# Patient Record
Sex: Female | Born: 1990 | Race: White | Hispanic: No | Marital: Single | Smoking: Never smoker
Health system: Southern US, Community
[De-identification: ages and names within clinical notes are randomized; demographics above are authoritative.]

## PROBLEM LIST (undated history)

## (undated) DIAGNOSIS — F32A Depression, unspecified: Secondary | ICD-10-CM

## (undated) DIAGNOSIS — F329 Major depressive disorder, single episode, unspecified: Secondary | ICD-10-CM

## (undated) DIAGNOSIS — F419 Anxiety disorder, unspecified: Secondary | ICD-10-CM

## (undated) DIAGNOSIS — K219 Gastro-esophageal reflux disease without esophagitis: Secondary | ICD-10-CM

## (undated) DIAGNOSIS — I10 Essential (primary) hypertension: Secondary | ICD-10-CM

## (undated) DIAGNOSIS — N946 Dysmenorrhea, unspecified: Secondary | ICD-10-CM

## (undated) DIAGNOSIS — J45990 Exercise induced bronchospasm: Secondary | ICD-10-CM

## (undated) DIAGNOSIS — T7840XA Allergy, unspecified, initial encounter: Secondary | ICD-10-CM

## (undated) HISTORY — DX: Anxiety disorder, unspecified: F41.9

## (undated) HISTORY — PX: WISDOM TOOTH EXTRACTION: SHX21

## (undated) HISTORY — DX: Allergy, unspecified, initial encounter: T78.40XA

## (undated) HISTORY — PX: TONSILLECTOMY: SUR1361

## (undated) HISTORY — DX: Essential (primary) hypertension: I10

## (undated) HISTORY — DX: Depression, unspecified: F32.A

---
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
## 2011-06-09 ENCOUNTER — Emergency Department (HOSPITAL_BASED_OUTPATIENT_CLINIC_OR_DEPARTMENT_OTHER)
Admission: EM | Admit: 2011-06-09 | Discharge: 2011-06-09 | Disposition: A | Discharge status: Home / Self Care | Payer: Medicaid Other | Attending: Emergency Medicine | Admitting: Emergency Medicine

## 2011-06-09 ENCOUNTER — Other Ambulatory Visit: Payer: Self-pay

## 2011-06-09 ENCOUNTER — Emergency Department (INDEPENDENT_AMBULATORY_CARE_PROVIDER_SITE_OTHER): Payer: Medicaid Other

## 2011-06-09 DIAGNOSIS — N390 Urinary tract infection, site not specified: Secondary | ICD-10-CM

## 2011-06-09 DIAGNOSIS — R002 Palpitations: Secondary | ICD-10-CM

## 2011-06-09 DIAGNOSIS — R42 Dizziness and giddiness: Secondary | ICD-10-CM | POA: Insufficient documentation

## 2011-06-09 HISTORY — DX: Exercise induced bronchospasm: J45.990

## 2011-06-09 LAB — URINALYSIS, ROUTINE W REFLEX MICROSCOPIC
Nitrite: NEGATIVE
Specific Gravity, Urine: 1.01 (ref 1.005–1.030)
Urobilinogen, UA: 0.2 mg/dL (ref 0.0–1.0)
pH: 6.5 (ref 5.0–8.0)

## 2011-06-09 LAB — BASIC METABOLIC PANEL
CO2: 25 mEq/L (ref 19–32)
Calcium: 10 mg/dL (ref 8.4–10.5)
Chloride: 103 mEq/L (ref 96–112)
Creatinine, Ser: 0.8 mg/dL (ref 0.50–1.10)
GFR calc Af Amer: >90 mL/min (ref >90)
Sodium: 140 mEq/L (ref 135–145)

## 2011-06-09 LAB — CBC
Platelets: 287 10*3/uL (ref 150–400)
RBC: 4.78 MIL/uL (ref 3.87–5.11)
RDW: 11.9 % (ref 11.5–15.5)
WBC: 7.3 10*3/uL (ref 4.0–10.5)

## 2011-06-09 LAB — URINE MICROSCOPIC-ADD ON

## 2011-06-09 LAB — D-DIMER, QUANTITATIVE: D-Dimer, Quant: 0.23 ug/mL-FEU (ref 0.00–0.48)

## 2011-06-09 MED ORDER — SULFAMETHOXAZOLE-TRIMETHOPRIM 800-160 MG PO TABS: 1.0000 | ORAL_TABLET | Freq: Two times a day (BID) | ORAL | Status: AC

## 2011-06-09 MED ORDER — SULFAMETHOXAZOLE-TRIMETHOPRIM 800-160 MG PO TABS: 1.0000 | ORAL_TABLET | Freq: Two times a day (BID) | ORAL | Status: DC

## 2011-06-09 NOTE — ED Notes (Signed)
Pt reports dizziness, nausea x 2 months and heart palpitations.

## 2011-06-09 NOTE — ED Provider Notes (Signed)
History     CSN: [id number]  Arrival date & time 06/09/11  1502   First MD Initiated Contact with Patient 06/09/11 1530      Chief Complaint  Patient presents with  . Dizziness    (Consider location/radiation/quality/duration/timing/severity/associated sxs/prior treatment) HPI Comments: Pt states that she has a history of palpitation but it would be once every couple of days, now she is having them a couple of times a day:pt has never been seen for them:pt states that she is on birth control which is new in the last couple of weeks:pt states that when that happens she gets dizzy and feels near syncopal and sometimes feels nauseated:pt states that she only has one caffeine drink per day   Patient is a 20 y.o. female presenting with palpitations. The history is provided by the patient. No language interpreter was used.  Palpitations  This is a new problem. The current episode started more than 1 week ago. The problem occurs daily. The problem has been gradually worsening. The problem is associated with an unknown factor. Associated symptoms include near-syncope. Pertinent negatives include no fever and no nausea. She has tried nothing for the symptoms.    Past Medical History  Diagnosis Date  . Exercise-induced asthma     History reviewed. No pertinent past surgical history.  No family history on file.  History  Substance Use Topics  . Smoking status: Never Smoker   . Smokeless tobacco: Not on file  . Alcohol Use: No    OB History    Grav Para Term Preterm Abortions TAB SAB Ect Mult Living                  Review of Systems  Constitutional: Negative for fever.  Cardiovascular: Positive for palpitations and near-syncope.  Gastrointestinal: Negative for nausea.  All other systems reviewed and are negative.    Allergies  Banana and Tomato  Home Medications   Current Outpatient Rx  Name Route Sig Dispense Refill  . ALBUTEROL SULFATE HFA 108 (90 BASE) MCG/ACT IN  AERS Inhalation Inhale 2 puffs into the lungs every 6 (six) hours as needed. For shortness of breath or wheezing     . DIPHENHYDRAMINE HCL 25 MG PO CAPS Oral Take 25-50 mg by mouth every 6 (six) hours as needed. For allergic reaction     . NORETHINDRONE ACET-ETHINYL EST 1.5-30 MG-MCG PO TABS Oral Take 1 tablet by mouth daily.      Marland Kitchen EPINEPHRINE 0.3 MG/0.3ML IJ DEVI Intramuscular Inject 0.3 mg into the muscle once.        BP 117/66  Pulse 73  Temp(Src) 98.9 F (37.2 C) (Oral)  Resp 16  Ht 5\' 4"  (1.626 m)  Wt 117 lb (53.071 kg)  BMI 20.08 kg/m2  SpO2 100%  LMP 05/19/2011  Physical Exam  Nursing note and vitals reviewed. Constitutional: She is oriented to person, place, and time. She appears well-developed and well-nourished.  HENT:  Head: Normocephalic and atraumatic.  Eyes: Pupils are equal, round, and reactive to light.  Neck: Normal range of motion. Neck supple.  Cardiovascular: Normal rate and regular rhythm.   Pulmonary/Chest: Effort normal and breath sounds normal.  Abdominal: Soft. Bowel sounds are normal.  Musculoskeletal: Normal range of motion.  Neurological: She is alert and oriented to person, place, and time.  Skin: Skin is warm and dry.  Psychiatric: She has a normal mood and affect.    ED Course  Procedures (including critical care time)  Labs Reviewed  URINALYSIS, ROUTINE W REFLEX MICROSCOPIC - Abnormal; Notable for the following:    Hgb urine dipstick TRACE (*)    Leukocytes, UA MODERATE (*)    All other components within normal limits  URINE MICROSCOPIC-ADD ON - Abnormal; Notable for the following:    Squamous Epithelial / LPF FEW (*)    All other components within normal limits  PREGNANCY, URINE  CBC  BASIC METABOLIC PANEL  D-DIMER, QUANTITATIVE   No results found.   Date: 06/09/2011  Rate: 65  Rhythm: normal sinus rhythm  QRS Axis: normal  Intervals: normal  ST/T Wave abnormalities: normal  Conduction Disutrbances:none  Narrative  Interpretation:   Old EKG Reviewed: none available   1. UTI (lower urinary tract infection)   2. Palpitation       MDM  Pt to follow up with cardiology for possible holter        Teressa Lower, NP 06/09/11 1736

## 2011-06-09 NOTE — ED Notes (Signed)
Pt aware of need for UA.  Pt states she is unable at present.

## 2011-06-11 NOTE — ED Provider Notes (Signed)
History/physical exam/procedure(s) were performed by non-physician practitioner and as supervising physician I was immediately available for consultation/collaboration. I have reviewed all notes and am in agreement with care and plan.   Adonai Helzer S Royalty Domagala, MD 06/11/11 1851 

## 2011-06-19 ENCOUNTER — Emergency Department (HOSPITAL_BASED_OUTPATIENT_CLINIC_OR_DEPARTMENT_OTHER)
Admission: EM | Admit: 2011-06-19 | Discharge: 2011-06-20 | Disposition: A | Discharge status: Home / Self Care | Payer: Medicaid Other | Attending: Emergency Medicine | Admitting: Emergency Medicine

## 2011-06-19 ENCOUNTER — Encounter (HOSPITAL_BASED_OUTPATIENT_CLINIC_OR_DEPARTMENT_OTHER): Payer: Self-pay | Admitting: *Deleted

## 2011-06-19 ENCOUNTER — Other Ambulatory Visit: Payer: Self-pay

## 2011-06-19 DIAGNOSIS — R059 Cough, unspecified: Secondary | ICD-10-CM | POA: Insufficient documentation

## 2011-06-19 DIAGNOSIS — R079 Chest pain, unspecified: Secondary | ICD-10-CM | POA: Insufficient documentation

## 2011-06-19 DIAGNOSIS — K219 Gastro-esophageal reflux disease without esophagitis: Secondary | ICD-10-CM | POA: Insufficient documentation

## 2011-06-19 DIAGNOSIS — R05 Cough: Secondary | ICD-10-CM

## 2011-06-19 DIAGNOSIS — Z79899 Other long term (current) drug therapy: Secondary | ICD-10-CM | POA: Insufficient documentation

## 2011-06-19 HISTORY — DX: Dysmenorrhea, unspecified: N94.6

## 2011-06-19 HISTORY — DX: Gastro-esophageal reflux disease without esophagitis: K21.9

## 2011-06-19 NOTE — ED Notes (Signed)
Pt reports she has had a cold- onset of chest pain last night- "tight"- also c/o SOB

## 2011-06-20 MED ORDER — HYDROCODONE-ACETAMINOPHEN 5-325 MG PO TABS
1.0000 | ORAL_TABLET | Freq: Once | ORAL | Status: AC
  Administered 2011-06-20: 1 via ORAL
  Filled 2011-06-20: qty 1

## 2011-06-20 MED ORDER — HYDROCODONE-ACETAMINOPHEN 5-325 MG PO TABS: 1.0000 | ORAL_TABLET | ORAL | Status: AC | PRN

## 2011-06-20 NOTE — ED Provider Notes (Signed)
History     CSN: [id number]  Arrival date & time 06/19/11  2325   First MD Initiated Contact with Patient 06/19/11 2340      Chief Complaint  Patient presents with  . Chest Pain  . Cough    (Consider location/radiation/quality/duration/timing/severity/associated sxs/prior treatment) The history is provided by the patient.   the patient reports several days of congestion and reports developing a "tightness in her chest last night.  She reports discomfort in her chest is worse when she sneezes or coughs.  It is worse with palpation.  She denies shortness of breath.  She denies unilateral leg swelling.  She has no prior history of DVT or pulmonary embolism.  She is on birth control but she does not smoke cigarettes.  She's tried ibuprofen and for the pain which has helped.  She reports her cough and her pain are not allowing her to sleep.  Past Medical History  Diagnosis Date  . Exercise-induced asthma   . Acid reflux   . Dysmenorrhea     Past Surgical History  Procedure Date  . Wisdom tooth extraction     No family history on file.  History  Substance Use Topics  . Smoking status: Never Smoker   . Smokeless tobacco: Never Used  . Alcohol Use: No    OB History    Grav Para Term Preterm Abortions TAB SAB Ect Mult Living                  Review of Systems  All other systems reviewed and are negative.    Allergies  Banana and Tomato  Home Medications   Current Outpatient Rx  Name Route Sig Dispense Refill  . CIPROFLOXACIN HCL 500 MG PO TABS Oral Take 500 mg by mouth 2 (two) times daily.      . ALBUTEROL SULFATE HFA 108 (90 BASE) MCG/ACT IN AERS Inhalation Inhale 2 puffs into the lungs every 6 (six) hours as needed. For shortness of breath or wheezing     . DIPHENHYDRAMINE HCL 25 MG PO CAPS Oral Take 25-50 mg by mouth every 6 (six) hours as needed. For allergic reaction     . EPINEPHRINE 0.3 MG/0.3ML IJ DEVI Intramuscular Inject 0.3 mg into the muscle once.        Marland Kitchen HYDROCODONE-ACETAMINOPHEN 5-325 MG PO TABS Oral Take 1 tablet by mouth every 4 (four) hours as needed for pain. 8 tablet 0  . NORETHINDRONE ACET-ETHINYL EST 1.5-30 MG-MCG PO TABS Oral Take 1 tablet by mouth daily.      . SULFAMETHOXAZOLE-TRIMETHOPRIM 800-160 MG PO TABS Oral Take 1 tablet by mouth every 12 (twelve) hours. 6 tablet 0    BP 114/70  Pulse 71  Temp 97.5 F (36.4 C)  Resp 17  Ht 5\' 4"  (1.626 m)  Wt 117 lb (53.071 kg)  BMI 20.08 kg/m2  SpO2 100%  LMP 06/13/2011  Physical Exam  Nursing note and vitals reviewed. Constitutional: She is oriented to person, place, and time. She appears well-developed and well-nourished. No distress.  HENT:  Head: Normocephalic and atraumatic.  Eyes: EOM are normal.  Neck: Normal range of motion.  Cardiovascular: Normal rate, regular rhythm and normal heart sounds.   Pulmonary/Chest: Effort normal and breath sounds normal.  Abdominal: Soft. She exhibits no distension. There is no tenderness.  Musculoskeletal: Normal range of motion.  Neurological: She is alert and oriented to person, place, and time.  Skin: Skin is warm and dry.  Psychiatric: She has a  normal mood and affect. Judgment normal.    ED Course  Procedures (including critical care time)   Date: 06/20/2011  Rate: 71  Rhythm: normal sinus rhythm  QRS Axis: normal  Intervals: normal  ST/T Wave abnormalities: normal  Conduction Disutrbances:none  Narrative Interpretation:   Old EKG Reviewed: No significant changes noted     Labs Reviewed - No data to display No results found.   1. Cough   2. Chest pain       MDM  The patient is well-appearing.  Her chest pain seems to be musculoskeletal as it is worse with coughing and sneezing.  She'll use her albuterol inhaler at home to help with cough.  8 hydrocodone prescribed to help with discomfort at nighttime.        Lyanne Co, MD 06/20/11 [phone]

## 2011-10-28 ENCOUNTER — Encounter (HOSPITAL_COMMUNITY): Payer: Self-pay | Admitting: *Deleted

## 2011-10-28 ENCOUNTER — Emergency Department (HOSPITAL_COMMUNITY)
Admission: EM | Admit: 2011-10-28 | Discharge: 2011-10-28 | Disposition: A | Discharge status: Home / Self Care | Payer: Medicaid Other | Attending: Emergency Medicine | Admitting: Emergency Medicine

## 2011-10-28 DIAGNOSIS — R55 Syncope and collapse: Secondary | ICD-10-CM | POA: Insufficient documentation

## 2011-10-28 DIAGNOSIS — R110 Nausea: Secondary | ICD-10-CM | POA: Insufficient documentation

## 2011-10-28 DIAGNOSIS — R42 Dizziness and giddiness: Secondary | ICD-10-CM | POA: Insufficient documentation

## 2011-10-28 DIAGNOSIS — K219 Gastro-esophageal reflux disease without esophagitis: Secondary | ICD-10-CM | POA: Insufficient documentation

## 2011-10-28 DIAGNOSIS — Z79899 Other long term (current) drug therapy: Secondary | ICD-10-CM | POA: Insufficient documentation

## 2011-10-28 LAB — URINALYSIS, ROUTINE W REFLEX MICROSCOPIC
Bilirubin Urine: NEGATIVE
Glucose, UA: NEGATIVE mg/dL
Hgb urine dipstick: NEGATIVE
Specific Gravity, Urine: 1.027 (ref 1.005–1.030)
Urobilinogen, UA: 0.2 mg/dL (ref 0.0–1.0)

## 2011-10-28 LAB — URINE MICROSCOPIC-ADD ON

## 2011-10-28 LAB — GLUCOSE, CAPILLARY: Glucose-Capillary: 97 mg/dL (ref 70–99)

## 2011-10-28 MED ORDER — SODIUM CHLORIDE 0.9 % IV BOLUS (SEPSIS)
1000.0000 mL | Freq: Once | INTRAVENOUS | Status: AC
  Administered 2011-10-28: 1000 mL via INTRAVENOUS

## 2011-10-28 MED ORDER — ONDANSETRON HCL 4 MG/2ML IJ SOLN
4.0000 mg | Freq: Once | INTRAMUSCULAR | Status: AC
  Administered 2011-10-28: 4 mg via INTRAVENOUS
  Filled 2011-10-28: qty 2

## 2011-10-28 MED ORDER — ONDANSETRON HCL 4 MG PO TABS: 4.0000 mg | ORAL_TABLET | Freq: Four times a day (QID) | ORAL | Status: AC

## 2011-10-28 NOTE — Discharge Instructions (Signed)
Near-Syncope Near-syncope is sudden weakness, dizziness, or feeling like you might pass out (faint). This may occur when getting up after sitting or while standing for a long period of time. Near-syncope can be caused by a drop in blood pressure. This is a common reaction, but it may occur to a greater degree in people taking medicines to control their blood pressure. Fainting often occurs when the blood pressure or pulse is too low to provide enough blood flow to the brain to keep you conscious. Fainting and near-syncope are not usually due to serious medical problems. However, certain people should be more cautious in the event of near-syncope, including elderly patients, patients with diabetes, and patients with a history of heart conditions (especially irregular rhythms).  CAUSES   Drop in blood pressure.   Physical pain.   Dehydration.   Heat exhaustion.   Emotional distress.   Low blood sugar.   Internal bleeding.   Heart and circulatory problems.   Infections.  SYMPTOMS   Dizziness.   Feeling sick to your stomach (nauseous).   Nearly fainting.   Body numbness.   Turning pale.   Tunnel vision.   Weakness.  HOME CARE INSTRUCTIONS   Lie down right away if you start feeling like you might faint. Breathe deeply and steadily. Wait until all the symptoms have passed. Most of these episodes last only a few minutes. You may feel tired for several hours.   Drink enough fluids to keep your urine clear or pale yellow.   If you are taking blood pressure or heart medicine, get up slowly, taking several minutes to sit and then stand. This can reduce dizziness that is caused by a drop in blood pressure.  SEEK IMMEDIATE MEDICAL CARE IF:   You have a severe headache.   Unusual pain develops in the chest, abdomen, or back.   There is bleeding from the mouth or rectum, or you have black or tarry stool.   An irregular heartbeat or a very rapid pulse develops.   You have  repeated fainting or seizure-like jerking during an episode.   You faint when sitting or lying down.   You develop confusion.   You have difficulty walking.   Severe weakness develops.   Vision problems develop.  MAKE SURE YOU:   Understand these instructions.   Will watch your condition.   Will get help right away if you are not doing well or get worse.  Document Released: 05/29/2005 Document Revised: 05/18/2011 Document Reviewed: 07/15/2010 Exit[employer] Patient Information 2012 [employer]. 

## 2011-10-28 NOTE — ED Notes (Signed)
Pt to be discharged once IV fluids are finished infusing.

## 2011-10-28 NOTE — ED Notes (Signed)
Pt waiting for IV fluids to finish infusing. Pt will then be discharged.

## 2011-10-28 NOTE — ED Notes (Signed)
Pt ambulatory to restroom without issue.

## 2011-10-28 NOTE — ED Notes (Signed)
Pt states this morning starting around 8 am she had an near syncope episode, which happened about 3 more times, pt states this also happen last Saturday but it is happening more often now. Pt states she gets dizzy, sweaty, and cold then it will just go away. She does not completely pass out. Pt is alert and oriented in no distress.

## 2011-10-28 NOTE — ED Provider Notes (Signed)
History     CSN: [id number]  Arrival date & time 10/28/11  1149   First MD Initiated Contact with Patient 10/28/11 1150      Chief Complaint  Patient presents with  . Near Syncope    (Consider location/radiation/quality/duration/timing/severity/associated sxs/prior treatment) HPI Comments: Presents with lightheadedness and associated nausea. States this happens typically when she is at work and standing for long periods of time.  Patient is a 21 y.o. female presenting with syncope. The history is provided by the patient. No language interpreter was used.  Loss of Consciousness This is a recurrent problem. The current episode started 1 to 2 hours ago. The problem occurs every several days. The problem has been gradually improving. Pertinent negatives include no chest pain, no abdominal pain, no headaches and no shortness of breath. The symptoms are aggravated by nothing. The symptoms are relieved by lying down. She has tried nothing for the symptoms. The treatment provided no relief.    Past Medical History  Diagnosis Date  . Exercise-induced asthma   . Acid reflux   . Dysmenorrhea     Past Surgical History  Procedure Date  . Wisdom tooth extraction   . Tonsillectomy     History reviewed. No pertinent family history.  History  Substance Use Topics  . Smoking status: Never Smoker   . Smokeless tobacco: Never Used  . Alcohol Use: No    OB History    Grav Para Term Preterm Abortions TAB SAB Ect Mult Living                  Review of Systems  Constitutional: Negative for fever, chills, activity change, appetite change and fatigue.  HENT: Negative for congestion, sore throat, rhinorrhea, neck pain and neck stiffness.   Respiratory: Negative for cough and shortness of breath.   Cardiovascular: Positive for syncope. Negative for chest pain and palpitations.  Gastrointestinal: Negative for nausea, vomiting and abdominal pain.  Genitourinary: Negative for dysuria,  urgency, frequency and flank pain.  Musculoskeletal: Negative for myalgias, back pain and arthralgias.  Neurological: Positive for light-headedness. Negative for dizziness, weakness, numbness and headaches.  All other systems reviewed and are negative.    Allergies  Banana; Peanuts; and Tomato  Home Medications   Current Outpatient Rx  Name Route Sig Dispense Refill  . CETIRIZINE HCL 10 MG PO TABS Oral Take 10 mg by mouth daily.    Marland Kitchen DIPHENHYDRAMINE HCL 25 MG PO CAPS Oral Take 25-50 mg by mouth every 6 (six) hours as needed. For allergic reaction    . ESCITALOPRAM OXALATE 5 MG PO TABS Oral Take 5 mg by mouth daily.    . NORETHINDRONE ACET-ETHINYL EST 1.5-30 MG-MCG PO TABS Oral Take 1 tablet by mouth daily.      Marland Kitchen ONDANSETRON HCL 4 MG PO TABS Oral Take 1 tablet (4 mg total) by mouth every 6 (six) hours. 12 tablet 0    BP 122/69  Pulse 98  Temp(Src) 98.6 F (37 C) (Oral)  SpO2 94%  LMP 10/16/2011  Physical Exam  Nursing note and vitals reviewed. Constitutional: She is oriented to person, place, and time. She appears well-developed and well-nourished. No distress.  HENT:  Head: Normocephalic and atraumatic.  Mouth/Throat: Oropharynx is clear and moist.  Eyes: Conjunctivae and EOM are normal. Pupils are equal, round, and reactive to light.  Neck: Normal range of motion. Neck supple.  Cardiovascular: Normal rate, regular rhythm, normal heart sounds and intact distal pulses.  Exam reveals no gallop  and no friction rub.   No murmur heard. Pulmonary/Chest: Effort normal and breath sounds normal. No respiratory distress. She exhibits no tenderness.  Abdominal: Soft. Bowel sounds are normal. There is no tenderness.  Musculoskeletal: Normal range of motion. She exhibits no tenderness.  Neurological: She is alert and oriented to person, place, and time. No cranial nerve deficit.  Skin: Skin is warm and dry. No rash noted.    ED Course  Procedures (including critical care  time)  Labs Reviewed  URINALYSIS, ROUTINE W REFLEX MICROSCOPIC - Abnormal; Notable for the following:    Protein, ur 100 (*)    All other components within normal limits  URINE MICROSCOPIC-ADD ON - Abnormal; Notable for the following:    Squamous Epithelial / LPF FEW (*)    All other components within normal limits  PREGNANCY, URINE  GLUCOSE, CAPILLARY   No results found.   1. Near syncope       MDM  Vasovagal near syncope. Receive urinalysis is negative. HCG is negative. Blood glucose is normal. She'll be discharged with instructions to continue aggressive oral hydration at home. Instructed to followup with her primary care physician.a liter of IV fluids.        Dayton Bailiff, MD 10/28/11 1253

## 2011-10-28 NOTE — ED Notes (Signed)
Per pt she's been having near syncope episodes, it started this morning around 8 am, has happen about 3 times, pt states she gets dizzy, cold, sweaty. Pt states the syncope spell usually goes away on its own. This also happen last Saturday but has become more frequent. Denies being a diabetic or having low blood pressure. Denies eating or drinking anything new.

## 2011-10-28 NOTE — ED Notes (Signed)
Pt given discharge instructions and explained them to the pt, pt in no distress upon discharge, escorted to the discharge window.

## 2012-02-05 ENCOUNTER — Encounter (HOSPITAL_BASED_OUTPATIENT_CLINIC_OR_DEPARTMENT_OTHER): Payer: Self-pay | Admitting: Emergency Medicine

## 2012-02-05 ENCOUNTER — Emergency Department (HOSPITAL_BASED_OUTPATIENT_CLINIC_OR_DEPARTMENT_OTHER)
Admission: EM | Admit: 2012-02-05 | Discharge: 2012-02-05 | Disposition: A | Discharge status: Home / Self Care | Payer: Medicaid Other | Attending: Emergency Medicine | Admitting: Emergency Medicine

## 2012-02-05 DIAGNOSIS — K219 Gastro-esophageal reflux disease without esophagitis: Secondary | ICD-10-CM | POA: Insufficient documentation

## 2012-02-05 DIAGNOSIS — H9209 Otalgia, unspecified ear: Secondary | ICD-10-CM | POA: Insufficient documentation

## 2012-02-05 DIAGNOSIS — J45990 Exercise induced bronchospasm: Secondary | ICD-10-CM | POA: Insufficient documentation

## 2012-02-05 MED ORDER — DESLORATADINE 5 MG PO TABS: 5.0000 mg | ORAL_TABLET | Freq: Every day | ORAL | Status: DC

## 2012-02-05 NOTE — ED Provider Notes (Signed)
History     CSN: [id number]  Arrival date & time 02/05/12  2322   First MD Initiated Contact with Patient 02/05/12 2323      Chief Complaint  Patient presents with  . Otalgia    (Consider location/radiation/quality/duration/timing/severity/associated sxs/prior treatment) Patient is a 21 y.o. female presenting with ear pain. The history is provided by the patient. No language interpreter was used.  Otalgia This is a new problem. The current episode started yesterday. There is pain in the left ear. The problem occurs constantly. There has been no fever. The pain is severe. Pertinent negatives include no ear discharge, no rhinorrhea and no diarrhea. Her past medical history does not include chronic ear infection.    Past Medical History  Diagnosis Date  . Exercise-induced asthma   . Acid reflux   . Dysmenorrhea     Past Surgical History  Procedure Date  . Wisdom tooth extraction   . Tonsillectomy     No family history on file.  History  Substance Use Topics  . Smoking status: Never Smoker   . Smokeless tobacco: Never Used  . Alcohol Use: No    OB History    Grav Para Term Preterm Abortions TAB SAB Ect Mult Living                  Review of Systems  Constitutional: Negative for fever.  HENT: Positive for ear pain. Negative for rhinorrhea and ear discharge.   Gastrointestinal: Negative for diarrhea.  All other systems reviewed and are negative.    Allergies  Banana; Peanuts; and Tomato  Home Medications   Current Outpatient Rx  Name Route Sig Dispense Refill  . CETIRIZINE HCL 10 MG PO TABS Oral Take 10 mg by mouth daily.    . DESLORATADINE 5 MG PO TABS Oral Take 1 tablet (5 mg total) by mouth daily. 7 tablet 0  . DIPHENHYDRAMINE HCL 25 MG PO CAPS Oral Take 25-50 mg by mouth every 6 (six) hours as needed. For allergic reaction    . ESCITALOPRAM OXALATE 5 MG PO TABS Oral Take 5 mg by mouth daily.    . NORETHINDRONE ACET-ETHINYL EST 1.5-30 MG-MCG PO TABS  Oral Take 1 tablet by mouth daily.        BP 116/70  Pulse 73  Temp 97.9 F (36.6 C) (Oral)  Resp 18  Ht 5\' 4"  (1.626 m)  Wt 117 lb (53.071 kg)  BMI 20.08 kg/m2  SpO2 100%  Physical Exam  Constitutional: She appears well-developed and well-nourished.  HENT:  Head: Normocephalic and atraumatic.  Right Ear: No mastoid tenderness. Tympanic membrane is not injected, not scarred and not perforated. No hemotympanum.  Left Ear: No mastoid tenderness. Tympanic membrane is not injected, not scarred and not perforated. No hemotympanum.  Mouth/Throat: Oropharynx is clear and moist.  Eyes: Conjunctivae are normal. Pupils are equal, round, and reactive to light.  Neck: Normal range of motion. Neck supple.  Cardiovascular: Normal rate and regular rhythm.   Pulmonary/Chest: Effort normal and breath sounds normal. She has no wheezes. She has no rales.  Abdominal: Soft. Bowel sounds are normal.  Musculoskeletal: Normal range of motion.  Neurological: She is alert.  Skin: Skin is warm and dry.  Psychiatric: She has a normal mood and affect.    ED Course  Procedures (including critical care time)  Labs Reviewed - No data to display No results found.   1. Otalgia       MDM  Eustachian tube congestion,  will prescribe decongestant       Kharlie Bring K Deshondra Worst-Rasch, MD 02/05/12 2332

## 2012-02-05 NOTE — ED Notes (Signed)
Pt c/o left ear pain.

## 2013-04-05 IMAGING — CR DG CHEST 2V
2 series · 2 of 2 positions shown · non-contrast
Comparison: None.

CLINICAL DATA: Dizziness, palpitations

CHEST - 2 VIEW

[w chest pa]
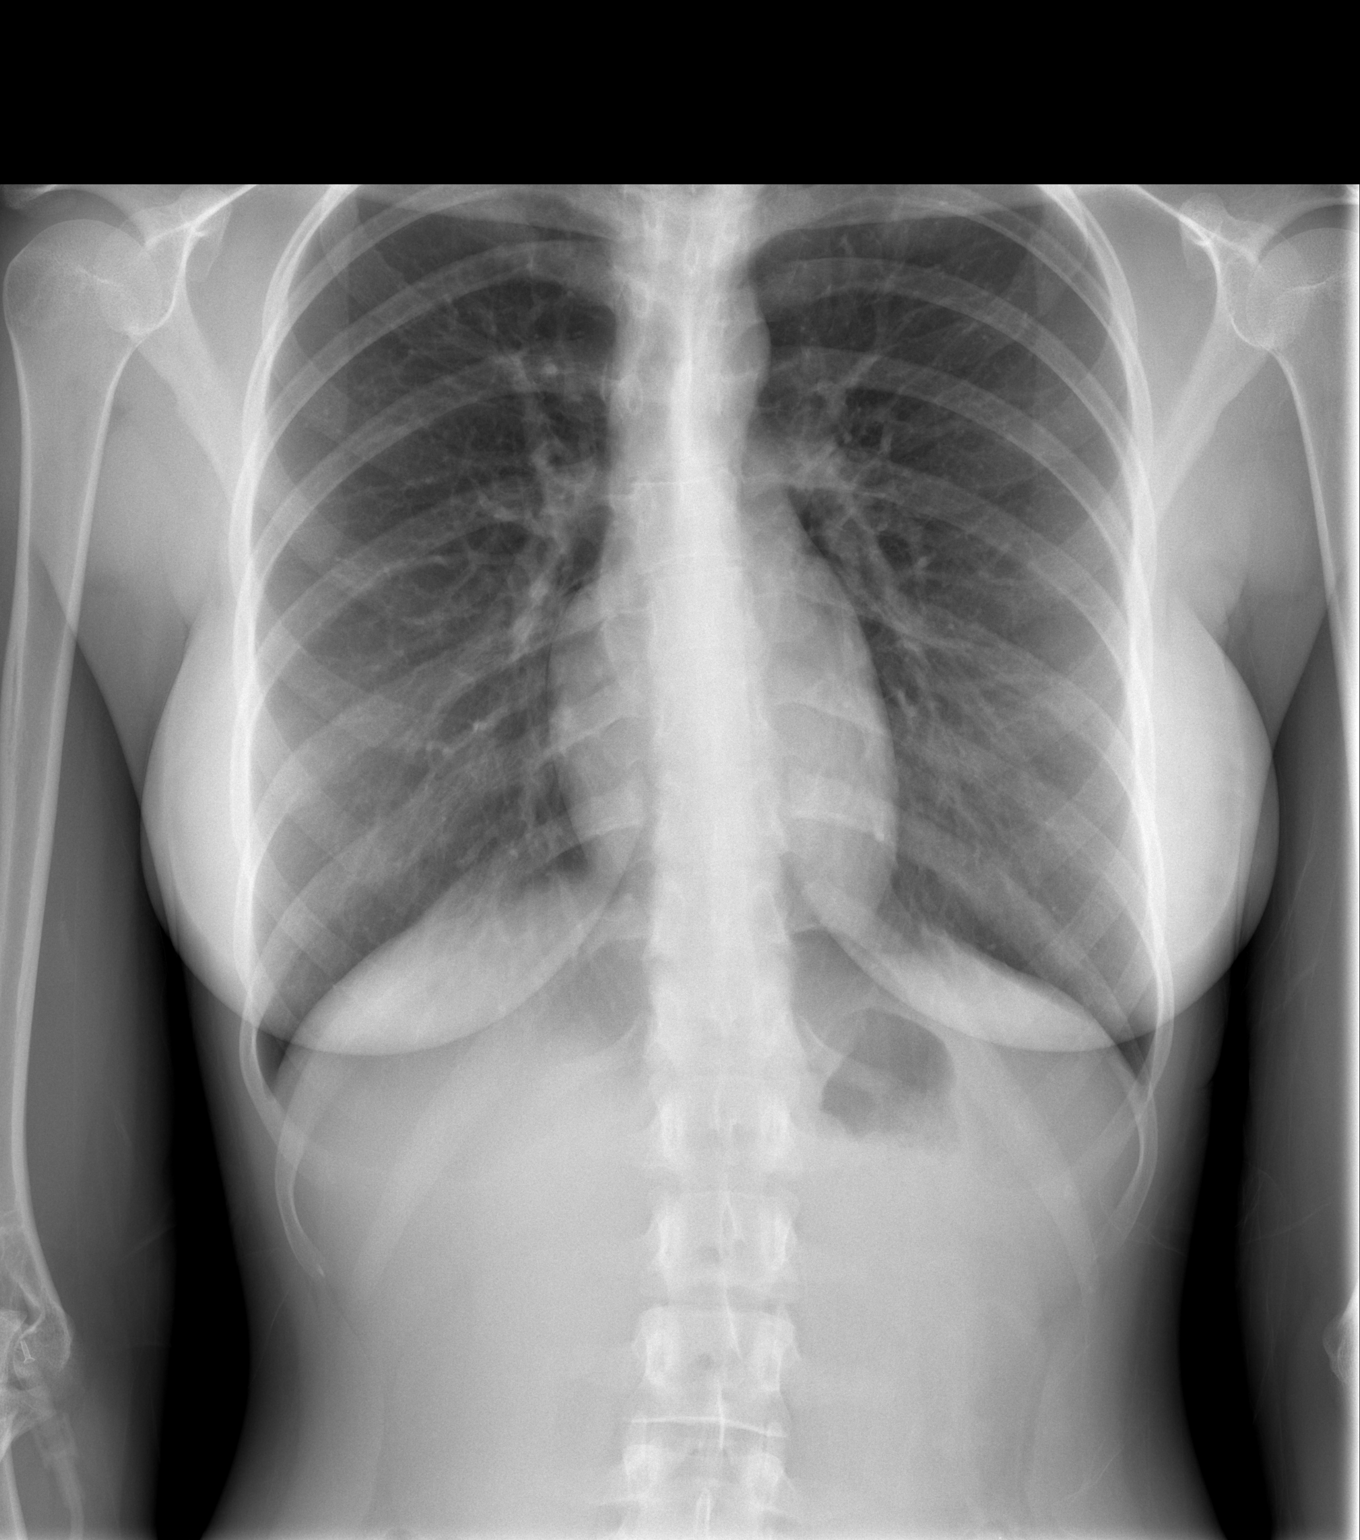

[w chest lat]
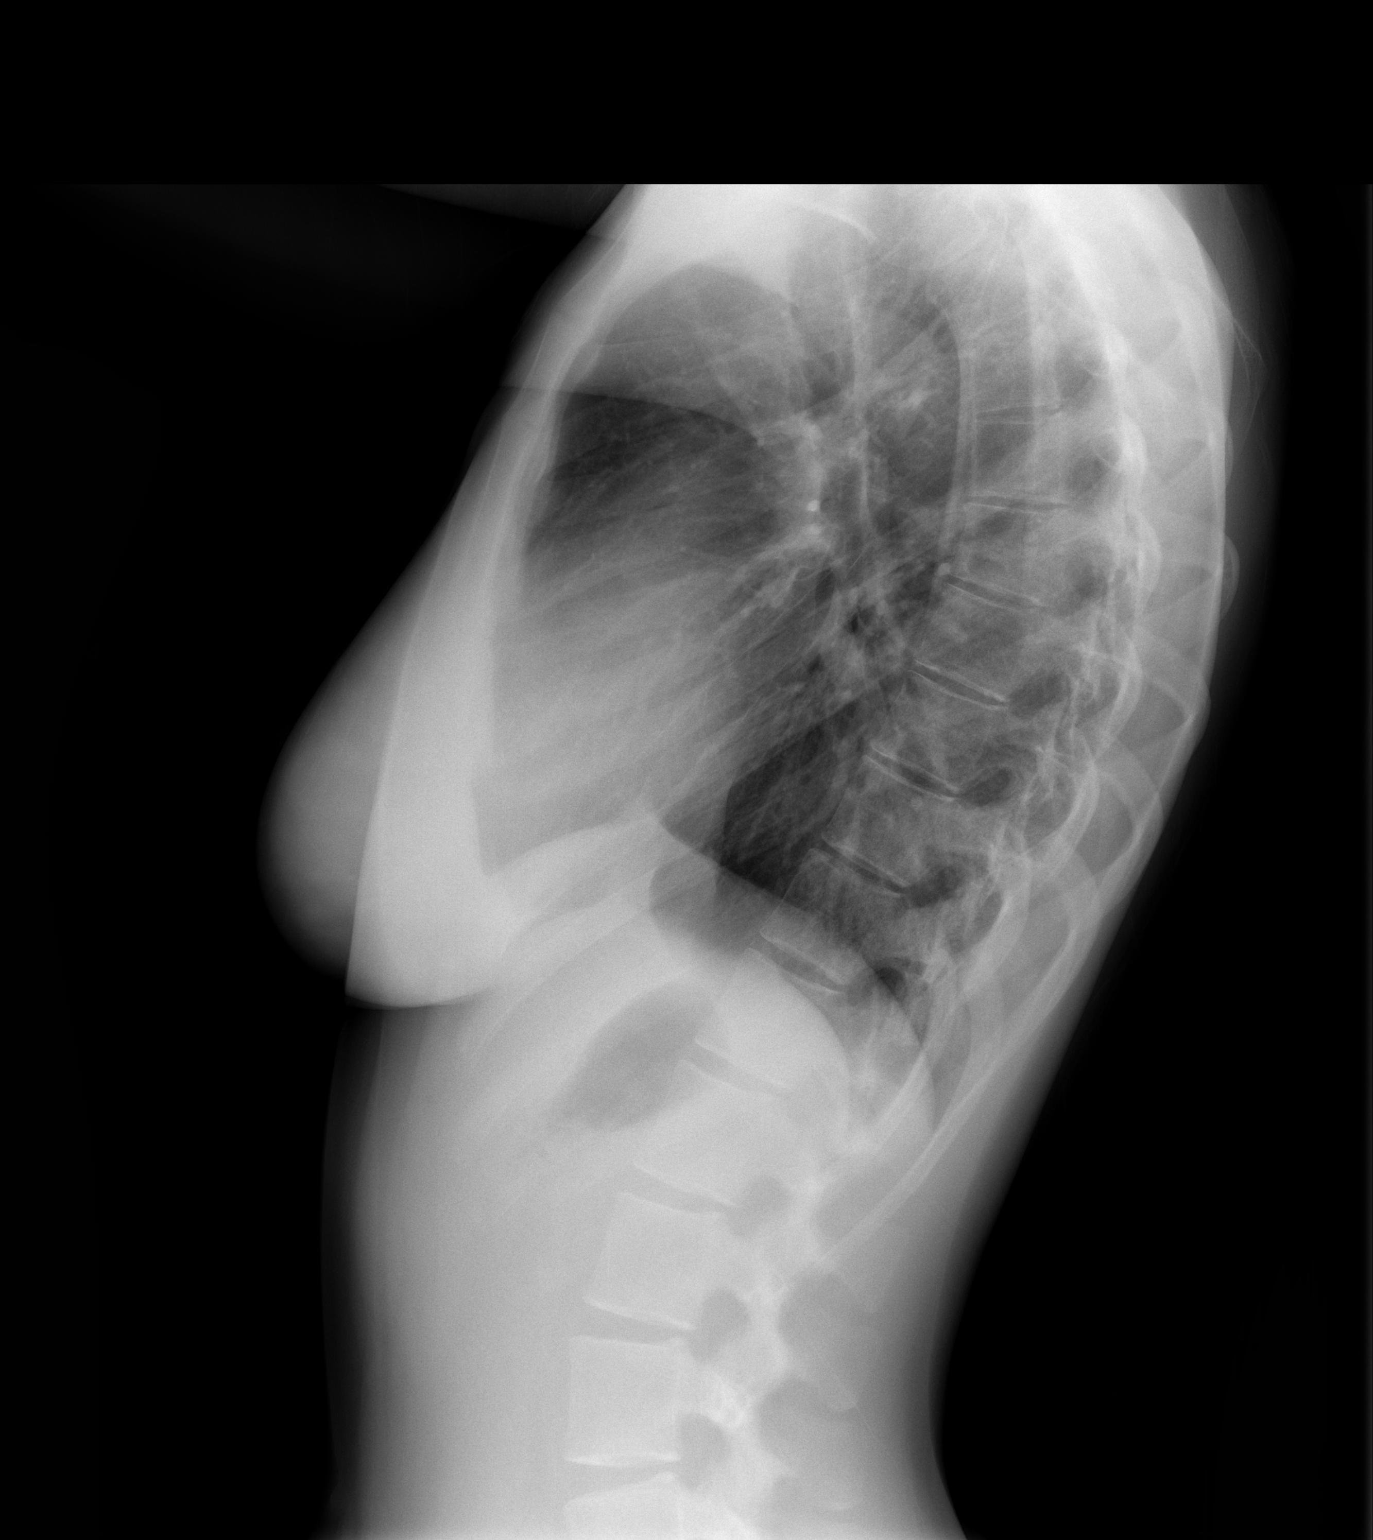

[2 of 2 positions shown; findings below may reference images not displayed]

FINDINGS: Cardiomediastinal silhouette is unremarkable.  Mild upper
thoracic dextroscoliosis.  No acute infiltrate or pleural effusion.
No pulmonary edema.
IMPRESSION: No active disease.  Mild thoracic dextroscoliosis.

## 2013-07-03 ENCOUNTER — Encounter (HOSPITAL_COMMUNITY): Payer: Self-pay | Admitting: Family

## 2013-07-03 ENCOUNTER — Inpatient Hospital Stay (HOSPITAL_COMMUNITY)
Admission: RE | Admit: 2013-07-03 | Discharge: 2013-07-08 | DRG: 885 | Disposition: A | Discharge status: Home / Self Care | Payer: No Typology Code available for payment source | Attending: Psychiatry | Admitting: Psychiatry

## 2013-07-03 DIAGNOSIS — F329 Major depressive disorder, single episode, unspecified: Secondary | ICD-10-CM | POA: Diagnosis present

## 2013-07-03 DIAGNOSIS — Z79899 Other long term (current) drug therapy: Secondary | ICD-10-CM

## 2013-07-03 DIAGNOSIS — R45851 Suicidal ideations: Secondary | ICD-10-CM

## 2013-07-03 DIAGNOSIS — F332 Major depressive disorder, recurrent severe without psychotic features: Principal | ICD-10-CM | POA: Diagnosis present

## 2013-07-03 DIAGNOSIS — F411 Generalized anxiety disorder: Secondary | ICD-10-CM | POA: Diagnosis present

## 2013-07-03 MED ORDER — HYDROXYZINE HCL 25 MG PO TABS
25.0000 mg | ORAL_TABLET | Freq: Four times a day (QID) | ORAL | Status: DC | PRN
  Administered 2013-07-03 – 2013-07-07 (×4): 25 mg via ORAL
  Filled 2013-07-03 (×2): qty 1
  Filled 2013-07-03: qty 30
  Filled 2013-07-03 (×2): qty 1

## 2013-07-03 MED ORDER — NICOTINE 21 MG/24HR TD PT24: 21.0000 mg | MEDICATED_PATCH | Freq: Every day | TRANSDERMAL | Status: DC | PRN

## 2013-07-03 MED ORDER — NON FORMULARY: Freq: Every day | Status: DC

## 2013-07-03 MED ORDER — MAGNESIUM HYDROXIDE 400 MG/5ML PO SUSP: 30.0000 mL | Freq: Every day | ORAL | Status: DC | PRN

## 2013-07-03 MED ORDER — NORETHIN-ETH ESTRAD-FE BIPHAS 1 MG-10 MCG / 10 MCG PO TABS
1.0000 | ORAL_TABLET | Freq: Every day | ORAL | Status: DC
  Administered 2013-07-03: 1 via ORAL

## 2013-07-03 MED ORDER — ALUM & MAG HYDROXIDE-SIMETH 200-200-20 MG/5ML PO SUSP
30.0000 mL | ORAL | Status: DC | PRN
  Administered 2013-07-07: 30 mL via ORAL

## 2013-07-03 MED ORDER — ACETAMINOPHEN 325 MG PO TABS
650.0000 mg | ORAL_TABLET | Freq: Four times a day (QID) | ORAL | Status: DC | PRN
  Administered 2013-07-03 – 2013-07-04 (×2): 650 mg via ORAL
  Filled 2013-07-03 (×2): qty 2

## 2013-07-03 MED ORDER — ESCITALOPRAM OXALATE 10 MG PO TABS
10.0000 mg | ORAL_TABLET | Freq: Every day | ORAL | Status: DC
  Administered 2013-07-03 – 2013-07-04 (×2): 10 mg via ORAL
  Filled 2013-07-03 (×4): qty 1

## 2013-07-03 MED ORDER — INFLUENZA VAC SPLIT QUAD 0.5 ML IM SUSP
0.5000 mL | INTRAMUSCULAR | Status: DC
  Filled 2013-07-03: qty 0.5

## 2013-07-03 MED ORDER — LORATADINE 10 MG PO TABS
10.0000 mg | ORAL_TABLET | Freq: Every day | ORAL | Status: DC
  Administered 2013-07-03 – 2013-07-08 (×6): 10 mg via ORAL
  Filled 2013-07-03 (×8): qty 1
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
## 2013-07-03 NOTE — Tx Team (Signed)
Initial Interdisciplinary Treatment Plan  PATIENT STRENGTHS: (choose at least two) Ability for insight Average or above average intelligence Communication skills General fund of knowledge Motivation for treatment/growth Physical Health Supportive family/friends Work skills  PATIENT STRESSORS: Financial difficulties Occupational concerns   PROBLEM LIST: Problem List/Patient Goals Date to be addressed Date deferred Reason deferred Estimated date of resolution  Suicidal ideation 07/03/2013   D/c        depression 07/03/2013   D/c        anxiety 07/03/2013   D/c                           DISCHARGE CRITERIA:  Ability to meet basic life and health needs Adequate post-discharge living arrangements Improved stabilization in mood, thinking, and/or behavior Medical problems require only outpatient monitoring Motivation to continue treatment in a less acute level of care Need for constant or close observation no longer present Reduction of life-threatening or endangering symptoms to within safe limits Safe-care adequate arrangements made Verbal commitment to aftercare and medication compliance  PRELIMINARY DISCHARGE PLAN: Attend aftercare/continuing care group Attend PHP/IOP Outpatient therapy Participate in family therapy Return to previous living arrangement  PATIENT/FAMIILY INVOLVEMENT: This treatment plan has been presented to and reviewed with the patient, Carol Rogers.  The patient and family have been given the opportunity to ask questions and make suggestions.  Earline MayotteKnight, Starkisha Tullis Shephard 07/03/2013, 6:35 PM

## 2013-07-03 NOTE — BH Assessment (Addendum)
Tele Assessment Note   Carol Rogers is an 23 y.o. female. Patient presents to [facility] as a walk in with her mother, mother's partner, and therapist-Mandi Sales executive. Patient sts that she is suicidal with a plan to run her car off a wrong. Says that when she drives she has these impulsive thoughts. Patient's suicidal ideations have been on/off since age 89. However, over the past 2-3 weeks has worsened. She is afraid to be alone and feels that she is unable to contract for safety. She told her mother and therapist this morning that she couldn't make any promises to keep herself safe.She denies prior suicide attempts. Her depression is triggered by her current living arrangements. Sts she lives in a 2 bedroom household with 7 people. She feels uncomfortable in the home. She shares a twin bed with another 22 yr old female. Sts, "Sometimes I sit in the closet just to get away from the people I live with".  She reports increased anxiety with daily panic attacks. She is calm and cooperative. No HI. No history of violent behaviors. No current AVH's. However, has a past history of seeing cats. She denies drug and alcohol use. No history of inpatient hospitalizations. She has her therapist Clent Ridges that she see's on a regular basis. No current psychiatrist, however; her PCP prescribes her anti depressants.   Axis I: Major Depression, Recurrent severe and  Anxiety Disorder Nos Axis II: Deferred Axis III:  Past Medical History  Diagnosis Date  . Exercise-induced asthma   . Acid reflux   . Dysmenorrhea    Axis IV: other psychosocial or environmental problems, problems related to social environment, problems with access to health care services and problems with primary support group Axis V: 31-40 impairment in reality testing  Past Medical History:  Past Medical History  Diagnosis Date  . Exercise-induced asthma   . Acid reflux   . Dysmenorrhea     Past Surgical History  Procedure Laterality Date   . Wisdom tooth extraction    . Tonsillectomy      Family History: No family history on file.  Social History:  reports that she has never smoked. She has never used smokeless tobacco. She reports that she does not drink alcohol or use illicit drugs.  Additional Social History:  Alcohol / Drug Use Pain Medications: SEE MAR Prescriptions: SEE MAR Over the Counter: SEE MAR History of alcohol / drug use?: No history of alcohol / drug abuse  CIWA:   COWS:    Allergies:  Allergies  Allergen Reactions  . Banana Anaphylaxis  . Peanuts [Peanut Oil] Hives and Shortness Of Breath    Tongue goes numb,   . Tomato Anaphylaxis    Home Medications:  Medications Prior to Admission  Medication Sig Dispense Refill  . cetirizine (ZYRTEC) 10 MG tablet Take 10 mg by mouth daily.      Marland Kitchen desloratadine (CLARINEX) 5 MG tablet Take 1 tablet (5 mg total) by mouth daily.  7 tablet  0  . diphenhydrAMINE (BENADRYL) 25 mg capsule Take 25-50 mg by mouth every 6 (six) hours as needed. For allergic reaction      . escitalopram (LEXAPRO) 5 MG tablet Take 5 mg by mouth daily.      . Norethindrone Acetate-Ethinyl Estradiol (JUNEL 1.5/30) 1.5-30 MG-MCG tablet Take 1 tablet by mouth daily.          OB/GYN Status:  No LMP recorded.  General Assessment Data Location of Assessment: [facility] Is this a  Tele or Face-to-Face Assessment?: Tele Assessment Is this an Initial Assessment or a Re-assessment for this encounter?: Initial Assessment Living Arrangements: Other (Comment) (Patient lives with 7 people) Can pt return to current living arrangement?: Yes Admission Status: Voluntary Is patient capable of signing voluntary admission?: Yes Transfer from: Acute Hospital Referral Source: Self/Family/Friend  Medical Screening Exam [facility] Walk-in ONLY) Medical Exam completed: No Reason for MSE not completed:  (pt admitted to the Adult unit)  [facility] Crisis Care Plan Living Arrangements: Other (Comment)  (Patient lives with 7 people) Name of Psychiatrist:  (No psychiatrist ) Name of Therapist:  (No therapist )  Education Status Is patient currently in school?: No  Risk to self Suicidal Ideation: Yes-Currently Present Suicidal Intent: Yes-Currently Present Is patient at risk for suicide?: Yes Suicidal Plan?: Yes-Currently Present Specify Current Suicidal Plan:  (run car off road) Access to Means:  (access to a car) What has been your use of drugs/alcohol within the last 12 months?:  (pt denies ) Previous Attempts/Gestures: No How many times?:  (0) Other Self Harm Risks:  (none reported ) Triggers for Past Attempts: Other (Comment) (patient reports no previous attempts/gestures ) Intentional Self Injurious Behavior: None Family Suicide History: No Recent stressful life event(s): Other (Comment);Financial Problems (pt reports living with so many people and finances) Persecutory voices/beliefs?: No Depression: No Depression Symptoms: Despondent;Feeling angry/irritable;Feeling worthless/self pity;Loss of interest in usual pleasures;Guilt;Fatigue;Isolating;Tearfulness;Insomnia Substance abuse history and/or treatment for substance abuse?: No Suicide prevention information given to non-admitted patients: Not applicable  Risk to Others Homicidal Ideation: No Thoughts of Harm to Others: No Current Homicidal Intent: No Current Homicidal Plan: No Access to Homicidal Means: No Identified Victim:  (n/a) History of harm to others?: No Assessment of Violence: None Noted Violent Behavior Description:  (patient is calm and cooperative) Does patient have access to weapons?: No Criminal Charges Pending?: No Does patient have a court date: No  Psychosis Hallucinations: None noted Delusions: None noted  Mental Status Report Appear/Hygiene: Disheveled Eye Contact: Good Motor Activity: Freedom of movement Speech: Logical/coherent Level of Consciousness: Alert Mood: Depressed Affect:  Appropriate to circumstance Anxiety Level: None Thought Processes: Coherent;Relevant Judgement: Unimpaired Orientation: Person;Place;Situation;Time Obsessive Compulsive Thoughts/Behaviors: None  Cognitive Functioning Concentration: Normal Memory: Recent Intact;Remote Intact IQ: Average Insight: Fair Impulse Control: Fair Appetite: Poor Weight Loss:  (none reported ) Weight Gain:  (none reported ) Sleep: Increased Total Hours of Sleep:  (patient reports staying in bed to sleep more ) Vegetative Symptoms: Staying in bed  ADLScreening [facility] Assessment Services) Patient's cognitive ability adequate to safely complete daily activities?: Yes Patient able to express need for assistance with ADLs?: Yes Independently performs ADLs?: Yes (appropriate for developmental age)  Prior Inpatient Therapy Prior Inpatient Therapy: No Prior Therapy Dates:  (n/a) Prior Therapy Facilty/Provider(s):  (n/a) Reason for Treatment:  (n/a)  Prior Outpatient Therapy Prior Outpatient Therapy: Yes Prior Therapy Dates:  (current ) Prior Therapy Facilty/Provider(s):  ([facility]) Reason for Treatment:  (depression and medication mgmt.)  ADL Screening (condition at time of admission) Patient's cognitive ability adequate to safely complete daily activities?: Yes Is the patient deaf or have difficulty hearing?: No Does the patient have difficulty seeing, even when wearing glasses/contacts?: No Does the patient have difficulty concentrating, remembering, or making decisions?: No Patient able to express need for assistance with ADLs?: Yes Does the patient have difficulty dressing or bathing?: No Independently performs ADLs?: Yes (appropriate for developmental age) Does the patient have difficulty walking or climbing stairs?: No Weakness of Legs: None  Weakness of Arms/Hands: None  Home Assistive Devices/Equipment Home Assistive Devices/Equipment: None    Abuse/Neglect Assessment (Assessment to be  complete while patient is alone) Physical Abuse: Denies Verbal Abuse: Denies Sexual Abuse: Denies Exploitation of patient/patient's resources: Denies Self-Neglect: Denies Values / Beliefs Cultural Requests During Hospitalization: None Spiritual Requests During Hospitalization: None   Advance Directives (For Healthcare) Advance Directive: Patient does not have advance directive Nutrition Screen- [facility] Adult/WL/AP Patient's home diet: Regular  Additional Information 1:1 In Past 12 Months?: No CIRT Risk: No Elopement Risk: No Does patient have medical clearance?: No     [facility]: [facility] by Dr. Henrene HawkingJonnalaggada, MD.     Melynda Rippleerry, Chauncy Mangiaracina [facility] 07/03/2013 3:55 PM

## 2013-07-03 NOTE — Progress Notes (Signed)
Adult Psychoeducational Group Note  Date:  07/03/2013 Time:  10:56 PM  Group Topic/Focus:  Goals Group:   The focus of this group is to help patients establish daily goals to achieve during treatment and discuss how the patient can incorporate goal setting into their daily lives to aide in recovery.  Participation Level:  Active  Participation Quality:  Appropriate  Affect:  Appropriate  Cognitive:  Appropriate  Insight: Appropriate  Engagement in Group:  Engaged  Modes of Intervention:  Discussion  Additional Comments:  Pt stated that she is sacred, best it's her first time.  Aldona LentoParker, Milford Cilento R 07/03/2013, 10:56 PM

## 2013-07-03 NOTE — Progress Notes (Signed)
D:  Pt +ve passive SI but contracts for safety. Pt deniesHI/AV. Pt is pleasant and cooperative. Pt mother brought birth control pills. [facility].   A: Pt was offered support and encouragement. Pt was given scheduled medications. Pt was encourage to attend groups. Q 15 minute checks were done for safety.   R:Pt attends groups and interacts well with peers and staff. Pt is taking medication. Pt has no complaints at this time.Pt receptive to treatment and safety maintained on unit.

## 2013-07-04 DIAGNOSIS — F411 Generalized anxiety disorder: Secondary | ICD-10-CM

## 2013-07-04 DIAGNOSIS — F329 Major depressive disorder, single episode, unspecified: Secondary | ICD-10-CM

## 2013-07-04 DIAGNOSIS — R45851 Suicidal ideations: Secondary | ICD-10-CM

## 2013-07-04 LAB — COMPREHENSIVE METABOLIC PANEL
ALBUMIN: 4 g/dL (ref 3.5–5.2)
ALK PHOS: 58 U/L (ref 39–117)
ALT: 12 U/L (ref 0–35)
AST: 22 U/L (ref 0–37)
BUN: 7 mg/dL (ref 6–23)
CHLORIDE: 102 meq/L (ref 96–112)
CO2: 26 meq/L (ref 19–32)
CREATININE: 0.64 mg/dL (ref 0.50–1.10)
Calcium: 9.5 mg/dL (ref 8.4–10.5)
GFR calc Af Amer: >90 mL/min (ref >90)
GFR calc non Af Amer: >90 mL/min (ref >90)
GLUCOSE: 99 mg/dL (ref 70–99)
Potassium: 4.6 mEq/L (ref 3.7–5.3)
Sodium: 140 mEq/L (ref 137–147)
Total Bilirubin: 0.3 mg/dL (ref 0.3–1.2)
Total Protein: 7.9 g/dL (ref 6.0–8.3)

## 2013-07-04 LAB — CBC WITH DIFFERENTIAL/PLATELET
BASOS ABS: 0 10*3/uL (ref 0.0–0.1)
Basophils Relative: 0 % (ref 0–1)
Eosinophils Absolute: 0.1 10*3/uL (ref 0.0–0.7)
Eosinophils Relative: 1 % (ref 0–5)
HEMATOCRIT: 39.1 % (ref 36.0–46.0)
HEMOGLOBIN: 13.1 g/dL (ref 12.0–15.0)
LYMPHS ABS: 2.2 10*3/uL (ref 0.7–4.0)
LYMPHS PCT: 40 % (ref 12–46)
MCH: 29.8 pg (ref 26.0–34.0)
MCHC: 33.5 g/dL (ref 30.0–36.0)
MCV: 88.9 fL (ref 78.0–100.0)
MONO ABS: 0.4 10*3/uL (ref 0.1–1.0)
MONOS PCT: 7 % (ref 3–12)
NEUTROS ABS: 2.9 10*3/uL (ref 1.7–7.7)
Neutrophils Relative %: 52 % (ref 43–77)
Platelets: 271 10*3/uL (ref 150–400)
RBC: 4.4 MIL/uL (ref 3.87–5.11)
RDW: 12 % (ref 11.5–15.5)
WBC: 5.6 10*3/uL (ref 4.0–10.5)

## 2013-07-04 LAB — URINALYSIS, DIPSTICK ONLY
BILIRUBIN URINE: NEGATIVE
GLUCOSE, UA: NEGATIVE mg/dL
Ketones, ur: NEGATIVE mg/dL
Nitrite: NEGATIVE
PROTEIN: NEGATIVE mg/dL
SPECIFIC GRAVITY, URINE: 1.007 (ref 1.005–1.030)
UROBILINOGEN UA: 0.2 mg/dL (ref 0.0–1.0)
pH: 6.5 (ref 5.0–8.0)

## 2013-07-04 LAB — RAPID URINE DRUG SCREEN, HOSP PERFORMED
AMPHETAMINES: NOT DETECTED
BARBITURATES: NOT DETECTED
Benzodiazepines: NOT DETECTED
COCAINE: NOT DETECTED
Opiates: NOT DETECTED
TETRAHYDROCANNABINOL: NOT DETECTED

## 2013-07-04 LAB — LIPID PANEL
Cholesterol: 162 mg/dL (ref 0–200)
HDL: 66 mg/dL (ref >39)
LDL CALC: 82 mg/dL (ref 0–99)
Total CHOL/HDL Ratio: 2.5 RATIO
Triglycerides: 68 mg/dL (ref <150)
VLDL: 14 mg/dL (ref 0–40)

## 2013-07-04 LAB — PREGNANCY, URINE: Preg Test, Ur: NEGATIVE

## 2013-07-04 MED ORDER — BUSPIRONE HCL 15 MG PO TABS
7.5000 mg | ORAL_TABLET | Freq: Two times a day (BID) | ORAL | Status: DC
  Administered 2013-07-04 – 2013-07-08 (×9): 7.5 mg via ORAL
  Filled 2013-07-04 (×11): qty 1

## 2013-07-04 MED ORDER — NORETHINDRONE ACET-ETHINYL EST 1-20 MG-MCG PO TABS
1.0000 | ORAL_TABLET | Freq: Every day | ORAL | Status: DC
  Administered 2013-07-04 – 2013-07-07 (×4): 1 via ORAL

## 2013-07-04 MED ORDER — BUPROPION HCL ER (XL) 150 MG PO TB24
150.0000 mg | ORAL_TABLET | Freq: Every day | ORAL | Status: DC
  Administered 2013-07-04 – 2013-07-08 (×5): 150 mg via ORAL
  Filled 2013-07-04 (×6): qty 1

## 2013-07-04 NOTE — [facility] Group Notes (Signed)
[facility] LCSW Group Therapy  Feelings Around Relapse 1:15 -2:30        07/04/2013  3:50 PM   Type of Therapy:  Group Therapy  Participation Level:  Appropriate  Participation Quality:  Appropriate  Affect:  Appropriate  Cognitive:  Attentive Appropriate  Insight:  Developing/Improving  Engagement in Therapy: Developing/Improving  Modes of Intervention:  Discussion Exploration Problem-Solving Supportive  Summary of Progress/Problems:  The topic for today was feelings around relapse.    Patient processed feelings toward relapse and was able to relate to peers. She agreed with peers that she would be in bed and in the dark and not taking care of hygiene.  Patient identified coping skills that can be used to prevent a relapse.   Wynn BankerHodnett, Carol Rogers 07/04/2013 3:50 PM
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
## 2013-07-04 NOTE — Progress Notes (Signed)
Recreation Therapy Notes  Date: 01.23.2015 Time: 2:45pm Location: [address] Dayroom   Group Topic: Building Healthy Support System   Goal Area(s) Addresses:  Patient will identify qualities needed to build healthy support system. Patient will identify why those qualities are important.   Behavioral Response: Appropriate   Intervention: Scenario  Activity: Patients were asked to identify all qualities needed to build a healthy support system, as if they were equivalent to ingredients needed to make a cookie recipe.    Education:  Pharmacist, communityocial Skills, Building control surveyorDischarge Planning,   Education Outcome: Acknowledges understanding  Clinical Observations/Feedback: Patient actively participated in group session, identifying qualities needed to build her healthy support system and identifying why those qualities are important. Group discussion focused around qualities needed for building a healthy support system, development of coping skills and the importance of trust and communication to relationships. Patient shared personal experiences and voiced her concern over her inability to effectively communicate with her support system. Patient provided support and encouragement, which she was receptive to. Patient offered support and encouragement to her peers as well.   Marykay Lexenise L Jalexia Lalli, LRT/CTRS  Jearl KlinefelterBlanchfield, Catelyn Friel L 07/04/2013 4:32 PM

## 2013-07-04 NOTE — Progress Notes (Signed)
D) Pt has attended the program and interacts with select peers. Rates her depression at a 6 and her hopelessness at a 5. States that she has thoughts of SI on and off. Feels that her life is completely out of control and is run by others around her. Feels alone A) Pt given support, reassurance and praise. Encouraged to use the time here wisely and invest in the program. Verbal contract made with Pt for her safety. Praised for her willingness to go to the groups and participate. R) Contracts for her safety.

## 2013-07-04 NOTE — Progress Notes (Signed)
Adult Psychoeducational Group Note  Date:  07/04/2013 Time:  08:00pm Group Topic/Focus:  Wrap-Up Group:   The focus of this group is to help patients review their daily goal of treatment and discuss progress on daily workbooks.  Participation Level:  Active  Participation Quality:  Appropriate and Attentive  Affect:  Appropriate  Cognitive:  Alert and Appropriate  Insight: Appropriate  Engagement in Group:  Engaged  Modes of Intervention:  Discussion and Education  Additional Comments:   Pt attended and participated in group. Discussion was on how their day went. Pt stated  She had a better day because she was able to get things out that she had been keeping in.  Shelly BombardGarner, Carol Rogers 07/04/2013, 9:26 PM

## 2013-07-04 NOTE — Progress Notes (Signed)
D: Pt denies SI/HI/AVH. Pt is pleasant and cooperative. Pt stated she feels better they changed her meds. Pt bright affect talking on milieu and being appropriate.   A: Pt was offered support and encouragement. Pt was given scheduled medications. Pt was encourage to attend groups. Q 15 minute checks were done for safety.  R:Pt attends groups and interacts well with peers and staff. Pt is taking medication. Pt has no complaints at this time.Pt receptive to treatment and safety maintained on unit.

## 2013-07-04 NOTE — Progress Notes (Signed)
Adult Psychoeducational Group Note  Date:  07/04/2013 Time:  10:00am Group Topic/Focus:  Relapse Prevention Planning:   The focus of this group is to define relapse and discuss the need for planning to combat relapse.  Participation Level:  Active  Participation Quality:  Appropriate and Attentive  Affect:  Appropriate  Cognitive:  Alert and Appropriate  Insight: Appropriate  Engagement in Group:  Engaged  Modes of Intervention:  Discussion and Education  Additional Comments: Pt attended and participated in group. Discussion was on relapse prevention and what their definition is. Pt stated relapse prevention means having tools you need to stay where you are instead of going back to where you are.   Shelly BombardGarner, Kyerra Vargo D 07/04/2013, 1:32 PM

## 2013-07-04 NOTE — [facility] Group Notes (Signed)
.  [facility] Dis[facility] Planning Group Note   07/04/2013 10:58 AM    Participation Quality:  Appropraite  Mood/Affect:  Appropriate  Depression Rating:  6  Anxiety Rating:  6  Thoughts of Suicide:  No  Will you contract for safety?   NA  Current AVH:  No  Plan for Discharge/Comments:  Patient attended discharge planning group and actively participated in group.  She advised of needing referral for medication management and [facility].  CSW provided all participants with daily workbook.   Transportation Means: Patient has transportation.   Supports:  Patient has a support system.   Krista Som, Joesph JulyQuylle Hairston

## 2013-07-04 NOTE — Tx Team (Signed)
Interdisciplinary Treatment Plan Update   Date Reviewed:  07/04/2013  Time Reviewed:  8:32 AM  Progress in Treatment:   Attending groups: Yes Participating in groups: Yes Taking medication as prescribed: Yes  Tolerating medication: Yes Family/Significant other contact made: No, but will ask patient for consent for collateral contact Patient understands diagnosis: Yes  Discussing patient identified problems/goals with staff: Yes Medical problems stabilized or resolved: Yes Denies suicidal/homicidal ideation: Yes Patient has not harmed self or others: Yes  For review of initial/current patient goals, please see plan of care.  Estimated Length of Stay:  3-5 days  Reasons for Continued Hospitalization:  Anxiety Depression Medication stabilization  New Problems/Goals identified:    Discharge Plan or Barriers:   Home with outpatient follow up to be determined   Additional Comments:  Carol Rogers is an 23 y.o. female. [facility] with her mother, mother's partner, and therapist-Mandi Sales executiveverocker. Patient sts that she is suicidal with a plan to run her car off a wrong. Says that when she drives she has these impulsive thoughts. Patient's suicidal ideations have been on/off since age 23. However, over the past 2-3 weeks has worsened. She is afraid to be alone and feels that she is unable to contract for safety. She told her mother and therapist this morning that she couldn't make any promises to keep herself safe.She denies prior suicide attempts   Attendees:  Patient:  07/04/2013 8:32 AM   Signature: Mervyn GayJ. Jonnalagadda, MD 07/04/2013 8:32 AM  Signature:  Lamount Crankerhris Judge, RN  07/04/2013 8:32 AM  Signature:   07/04/2013 8:32 AM  Signature: 07/04/2013 8:32 AM  Signature:   07/04/2013 8:32 AM  Signature:  Juline PatchQuylle Montana Bryngelson, LCSW 07/04/2013 8:32 AM  Signature:  Reyes Ivanhelsea Horton, LCSW 07/04/2013 8:32 AM  Signature:  Leisa LenzValerie Enoch, Care Coordinator 07/04/2013 8:32 AM  Signature:   07/04/2013 8:32 AM  Signature 07/04/2013  8:32 AM  Signature:   Onnie BoerJennifer Clark, RN [facility] 07/04/2013  8:32 AM  Signature:   07/04/2013  8:32 AM    Scribe for Treatment Team:   Juline PatchQuylle Mahdiya Mossberg,  07/04/2013 8:32 AM
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
## 2013-07-05 LAB — TSH: TSH: 2.356 u[IU]/mL (ref 0.350–4.500)

## 2013-07-05 MED ORDER — DIPHENHYDRAMINE HCL 25 MG PO CAPS
25.0000 mg | ORAL_CAPSULE | Freq: Four times a day (QID) | ORAL | Status: DC | PRN
  Filled 2013-07-05: qty 1

## 2013-07-05 MED ORDER — ALBUTEROL SULFATE HFA 108 (90 BASE) MCG/ACT IN AERS
2.0000 | INHALATION_SPRAY | Freq: Four times a day (QID) | RESPIRATORY_TRACT | Status: DC | PRN
  Administered 2013-07-05: 2 via RESPIRATORY_TRACT
  Filled 2013-07-05: qty 6.7

## 2013-07-05 NOTE — Progress Notes (Signed)
[facility] MD Progress Note  07/05/2013 2:38 PM Carol Rogers  MRN:  [id number] Subjective:  Carol Rogers is a 23 year old female who presents to [facility] after having suicidal thoughts. During today's assessment she states she feels better but tired "having nightmares that have become more vivid since they changed my meds." She states she is feeling better than she was. Initially she didn't see a way out, now she is able to talk and express herself better. She still expresses concerns about increased levels of depression, rating 6/10 and her anxiety is at 5/10.  Upon discharge she is hoping to go home, reports family support, although she is afraid of going back on a bad path once she gets home. States "at times it felt like I was just functioning, more like on auto pilot."  She is attending groups. Denies any SI/HI/AVH.  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 Exercise-induced asthma   . Acid reflux   . Dysmenorrhea    Axis IV: economic problems, housing problems, occupational problems, other psychosocial or environmental problems, problems related to social environment and problems with primary support group Axis V: 41-50 serious symptoms  ADL's:  Intact  Sleep: fair; vivid nightmares  Appetite:  Good  Suicidal Ideation:  Plan:  denies Intent:  Denies Means:  denies Homicidal Ideation:  Plan:  Denies Intent:  Denies Means:  denies AEB (as evidenced by):  Psychiatric Specialty Exam: Review of Systems  Constitutional: Negative.   Skin: Negative.   Neurological: Positive for tremors.   Psychiatric/Behavioral: Positive for depression. Negative for suicidal ideas, hallucinations, memory loss and substance abuse. The patient is nervous/anxious. The patient does not have insomnia.   All other systems reviewed and are negative.    Blood pressure 110/74, pulse 89, temperature 98 F (36.7 C), temperature source Oral, resp. rate 16, height 5' 4"  (1.626 m), weight 59.875 kg (132 lb), last menstrual period 02/10/2013, SpO2 99.00%.Body mass index is 22.65 kg/(m^2).  General Appearance: Neat and Well Groomed  Engineer, water::  Good  Speech:  Normal Rate  Volume:  Normal  Mood:  Euthymic  Affect:  Appropriate  Thought Process:  Coherent  Orientation:  Full (Time, Place, and Person)  Thought Content:  WDL  Suicidal Thoughts:  No  Homicidal Thoughts:  No  Memory:  Immediate;   Good Recent;   Good Remote;   Good  Judgement:  Fair  Insight:  Good  Psychomotor Activity:  Normal  Concentration:  Good  Recall:  Good  Akathisia:  No  Handed:  Right  AIMS (if indicated):     Assets:  Communication Skills Desire for Improvement Leisure Time Social Support  Sleep:  Number of Hours: 5.75   Current Medications: Current Facility-Administered Medications  Medication Dose Route Frequency Provider Last Rate Last Dose  . acetaminophen (TYLENOL) tablet 650 mg  650 mg Oral Q6H PRN Benjamine Mola, FNP   650 mg at 07/04/13 2308  . alum & mag hydroxide-simeth (MAALOX/MYLANTA) 200-200-20 MG/5ML suspension 30 mL  30 mL Oral Q4H PRN Benjamine Mola, FNP      . buPROPion (WELLBUTRIN XL) 24 hr tablet 150 mg  150 mg Oral Daily Milbert Coulter R  Jonnalagadda, MD   150 mg at 07/05/13 0754  . busPIRone (BUSPAR) tablet 7.5 mg  7.5 mg Oral BID Durward Parcel, MD   7.5 mg at 07/05/13 0754  . hydrOXYzine (ATARAX/VISTARIL) tablet 25 mg  25 mg Oral Q6H PRN Benjamine Mola, FNP   25 mg at 07/03/13 1844  . loratadine (CLARITIN) tablet 10 mg  10 mg Oral Daily Benjamine Mola, FNP   10 mg at 07/05/13 0754   . magnesium hydroxide (MILK OF MAGNESIA) suspension 30 mL  30 mL Oral Daily PRN Benjamine Mola, FNP      . norethindrone-ethinyl estradiol (MICROGESTIN,JUNEL,LOESTRIN) 1-20 MG-MCG tablet 1 tablet  1 tablet Oral QHS Hampton Abbot, MD   1 tablet at 07/04/13 2203    Lab Results:  Results for orders placed during the hospital encounter of 07/03/13 (from the past 48 hour(s))  URINALYSIS, DIPSTICK ONLY     Status: Abnormal   Collection Time    07/04/13  2:38 PM      Result Value Range   Specific Gravity, Urine 1.007  1.005 - 1.030   pH 6.5  5.0 - 8.0   Glucose, UA NEGATIVE  NEGATIVE mg/dL   Hgb urine dipstick TRACE (*) NEGATIVE   Bilirubin Urine NEGATIVE  NEGATIVE   Ketones, ur NEGATIVE  NEGATIVE mg/dL   Protein, ur NEGATIVE  NEGATIVE mg/dL   Urobilinogen, UA 0.2  0.0 - 1.0 mg/dL   Nitrite NEGATIVE  NEGATIVE   Leukocytes, UA TRACE (*) NEGATIVE   Comment: Performed at [place] [facility]     Status: None   Collection Time    07/04/13  2:38 PM      Result Value Range   Opiates NONE DETECTED  NONE DETECTED   Cocaine NONE DETECTED  NONE DETECTED   Benzodiazepines NONE DETECTED  NONE DETECTED   Amphetamines NONE DETECTED  NONE DETECTED   Tetrahydrocannabinol NONE DETECTED  NONE DETECTED   Barbiturates NONE DETECTED  NONE DETECTED   Comment:            DRUG SCREEN FOR MEDICAL PURPOSES     ONLY.  IF CONFIRMATION IS NEEDED     FOR ANY PURPOSE, NOTIFY LAB     WITHIN 5 DAYS.                LOWEST DETECTABLE LIMITS     FOR URINE DRUG SCREEN     Drug Class       Cutoff (ng/mL)     Amphetamine      1000     Barbiturate      200     Benzodiazepine   419     Tricyclics       379     Opiates          300     Cocaine          300     THC              50     Performed at [place], URINE     Status: None   Collection Time    07/04/13  2:38 PM      Result Value Range   Preg Test, Ur NEGATIVE  NEGATIVE    Comment:            THE SENSITIVITY OF THIS     METHODOLOGY IS >20 mIU/mL.  Performed at [facility]  CBC WITH DIFFERENTIAL     Status: None   Collection Time    07/04/13  7:41 PM      Result Value Range   WBC 5.6  4.0 - 10.5 K/uL   RBC 4.40  3.87 - 5.11 MIL/uL   Hemoglobin 13.1  12.0 - 15.0 g/dL   HCT 39.1  36.0 - 46.0 %   MCV 88.9  78.0 - 100.0 fL   MCH 29.8  26.0 - 34.0 pg   MCHC 33.5  30.0 - 36.0 g/dL   RDW 12.0  11.5 - 15.5 %   Platelets 271  150 - 400 K/uL   Neutrophils Relative % 52  43 - 77 %   Neutro Abs 2.9  1.7 - 7.7 K/uL   Lymphocytes Relative 40  12 - 46 %   Lymphs Abs 2.2  0.7 - 4.0 K/uL   Monocytes Relative 7  3 - 12 %   Monocytes Absolute 0.4  0.1 - 1.0 K/uL   Eosinophils Relative 1  0 - 5 %   Eosinophils Absolute 0.1  0.0 - 0.7 K/uL   Basophils Relative 0  0 - 1 %   Basophils Absolute 0.0  0.0 - 0.1 K/uL   Comment: Performed at [place] PANEL     Status: None   Collection Time    07/04/13  7:41 PM      Result Value Range   Sodium 140  137 - 147 mEq/L   Potassium 4.6  3.7 - 5.3 mEq/L   Chloride 102  96 - 112 mEq/L   CO2 26  19 - 32 mEq/L   Glucose, Bld 99  70 - 99 mg/dL   BUN 7  6 - 23 mg/dL   Creatinine, Ser 0.64  0.50 - 1.10 mg/dL   Calcium 9.5  8.4 - 10.5 mg/dL   Total Protein 7.9  6.0 - 8.3 g/dL   Albumin 4.0  3.5 - 5.2 g/dL   AST 22  0 - 37 U/L   Comment: SLIGHT HEMOLYSIS     HEMOLYSIS AT THIS LEVEL MAY AFFECT RESULT   ALT 12  0 - 35 U/L   Alkaline Phosphatase 58  39 - 117 U/L   Total Bilirubin 0.3  0.3 - 1.2 mg/dL   GFR calc non Af Amer >90  >90 mL/min   GFR calc Af Amer >90  >90 mL/min   Comment: (NOTE)     The eGFR has been calculated using the CKD EPI equation.     This calculation has not been validated in all clinical situations.     eGFR's persistently <90 mL/min signify possible Chronic Kidney     Disease.     Performed at [facility]  LIPID  PANEL     Status: None   Collection Time    07/04/13  7:41 PM      Result Value Range   Cholesterol 162  0 - 200 mg/dL   Triglycerides 68  <150 mg/dL   HDL 66  >39 mg/dL   Total CHOL/HDL Ratio 2.5     VLDL 14  0 - 40 mg/dL   LDL Cholesterol 82  0 - 99 mg/dL   Comment:            Total Cholesterol/HDL:CHD Risk     Coronary Heart Disease Risk Table  Men   Women      1/2 Average Risk   3.4   3.3      Average Risk       5.0   4.4      2 X Average Risk   9.6   7.1      3 X Average Risk  23.4   11.0                Use the calculated Patient Ratio     above and the CHD Risk Table     to determine the patient's CHD Risk.                ATP III CLASSIFICATION (LDL):      <100     mg/dL   Optimal      100-129  mg/dL   Near or Above                        Optimal      130-159  mg/dL   Borderline      160-189  mg/dL   High      >190     mg/dL   Very High     Performed at [facility]  TSH     Status: None   Collection Time    07/04/13  7:41 PM      Result Value Range   TSH 2.356  0.350 - 4.500 uIU/mL   Comment: Performed at [facility]    Physical Findings: AIMS: Facial and Oral Movements Muscles of Facial Expression: None, normal Lips and Perioral Area: None, normal Jaw: None, normal Tongue: None, normal,Extremity Movements Upper (arms, wrists, hands, fingers): None, normal Lower (legs, knees, ankles, toes): None, normal, Trunk Movements Neck, shoulders, hips: None, normal, Overall Severity Severity of abnormal movements (highest score from questions above): None, normal Incapacitation due to abnormal movements: None, normal Patient's awareness of abnormal movements (rate only patient's report): No Awareness, Dental Status Current problems with teeth and/or dentures?: No Does patient usually wear dentures?: No  CIWA:  CIWA-Ar Total: 4 COWS:  COWS Total Score: 2  Treatment Plan Summary: Daily contact with patient to assess and  evaluate symptoms and progress in treatment Medication management  Plan: Plan: Review of chart, vital signs, medications, and notes.  1-Admit for crisis management and stabilization. Estimated length of stay 5-7 days past her current stay of 1  2-Individual and group therapy encouraged  3-Medication management for depression and anxiety to reduce current symptoms to base line and improve the patient's overall level of functioning: Medications reviewed with the patient and Trazodone added for sleep issues and Vistaril 25 mg every six hours PRN anxiety  4-Coping skills for depression, substance abuse, anger issues, and anxiety developing--  5-Continue crisis stabilization and management  6-Address health issues--monitoring vital signs, stable  7-Treatment plan in progress to prevent relapse of depression, angry outbursts, and anxiety  8-Psychosocial education regarding relapse prevention and self-care  9-Health care follow up as needed for any health concerns  10-Call for consult with hospitalist for additional specialty patient services as needed.   Medical Decision Making Problem Points:  Established problem, stable/improving (1) and Review of psycho-social stressors (1) Data Points:  Discuss tests with performing physician (1) Review or order clinical lab tests (1) Review of medication regiment & side effects (2) Review of new medications or change in dosage (2)  I certify that inpatient services furnished can  reasonably be expected to improve the patient's condition.   Priscille Loveless S 07/05/2013, 2:38 PM

## 2013-07-05 NOTE — [facility] Group Notes (Signed)
[facility] Group Notes:  (Nursing/MHT/Case Management/Adjunct)  Date:  07/05/2013  Time:  10:59 AM  Type of Therapy:  Psychoeducational Skills  Participation Level:  Active  Participation Quality:  Appropriate  Affect:  Blunted  Cognitive:  Alert  Insight:  Appropriate  Engagement in Group:  Engaged  Modes of Intervention:  Discussion  Summary of Progress/Problems:pt stated she wanted to turn a negative into a positive.   Rodman KeyWebb, Zev Blue [facility] 07/05/2013, 10:59 AM

## 2013-07-05 NOTE — [facility] Group Notes (Signed)
[facility] LCSW Group Therapy Note  07/05/2013 1:15  To 2:15 PM  Type of Therapy and Topic:  Group Therapy: Avoiding Self-Sabotaging and Enabling Behaviors  Participation Level:  Active   Mood: Appropriate  Description of Group:     Learn how to identify obstacles, self-sabotaging and enabling behaviors, what are they, why do we do them and what needs do these behaviors meet? Discuss unhealthy relationships and how to have positive healthy boundaries with those that sabotage and enable. Explore aspects of self-sabotage and enabling in yourself and how to limit these self-destructive behaviors in everyday life.  Therapeutic Goals: 1. Patient will identify one obstacle that relates to self-sabotage and enabling behaviors 2. Patient will identify one personal self-sabotaging or enabling behavior they did prior to admission 3. Patient able to establish a plan to change the above identified behavior they did prior to admission:  4. Patient will demonstrate ability to communicate their needs through discussion and/or role plays.   Summary of Patient Progress: The main focus of today's process group was to explain what "self-sabotage" means and use Motivational Interviewing to discuss what benefits, negative or positive, were involved in a self-identified self-sabotaging behavior. We then talked about reasons the patient may want to change the behavior and her current desire to change.  Pt shared stressors of household conflict and unemployment. Patient shared that her negative self talk often leads to Worry and Isolation and is likely her biggest self sabotage but very difficult to recognize. Other in group identified with patient's description of difficulty to recognize. Carol Rogers offered support to others in group.   Therapeutic Modalities:   Cognitive Behavioral Therapy Person-Centered Therapy Motivational Interviewing   Carney Bernatherine C Harrill, LCSW

## 2013-07-05 NOTE — Progress Notes (Signed)
Psychoeducational Group Note  Date: 07/05/2013 Time:  1015  Group Topic/Focus:  Identifying Needs:   The focus of this group is to help patients identify their personal needs that have been historically problematic and identify healthy behaviors to address their needs.  Participation Level:  Active  Participation Quality:  Appropriate  Affect:  Appropriate  Cognitive:  Appropriate  Insight:  Improving  Engagement in Group:  Engaged  Additional Comments:    Chirsty Armistead A 

## 2013-07-05 NOTE — Progress Notes (Signed)
D) Pt rates her depression at a 6 and her hopelessness at a 4. Denies SI and HI. Pt has been attending the groups and has participated. Denies SI and HI. Pt interacts well with her peers and states that she has learned a lot from the groups A) Given support, reassurance and praise. Encouragement given for Pt to complete her packet. R) Denies SI and HI

## 2013-07-05 NOTE — Progress Notes (Signed)
Adult Psychoeducational Group Note  Date:  07/05/2013 Time:  9:01 PM  Group Topic/Focus:  Wrap-Up Group:   The focus of this group is to help patients review their daily goal of treatment and discuss progress on daily workbooks.  Participation Level:  Active  Participation Quality:  Appropriate  Affect:  Appropriate  Cognitive:  Appropriate  Insight: Appropriate  Engagement in Group:  Engaged  Modes of Intervention:  Discussion  Additional Comments:  Patient attended group and was engaged. She said that she misses her mom and little sister. Because of this, she felt sad this morning. As the day progressed, she began to feel better. Her mom brought in a picture of her little sister, and the patient reported that this helped her feel better.   Rosilyn MingsMingia, Danna Sewell A 07/05/2013, 9:01 PM

## 2013-07-06 MED ORDER — HYDROXYZINE HCL 25 MG PO TABS
25.0000 mg | ORAL_TABLET | ORAL | Status: AC
  Administered 2013-07-06: 25 mg via ORAL
  Filled 2013-07-06 (×2): qty 1

## 2013-07-06 MED ORDER — PRAZOSIN HCL 1 MG PO CAPS
1.0000 mg | ORAL_CAPSULE | Freq: Once | ORAL | Status: AC
  Administered 2013-07-06: 1 mg via ORAL
  Filled 2013-07-06 (×2): qty 1

## 2013-07-06 NOTE — Progress Notes (Signed)
Adult Psychoeducational Group Note  Date:  07/06/2013 Time:  5:22 PM  Group Topic/Focus:  Therapeutic Activity   Participation Level:  Active  Participation Quality:  Appropriate and Supportive  Affect:  Appropriate  Cognitive:  Appropriate  Insight: Appropriate  Engagement in Group:  Engaged  Modes of Intervention:  Activity and Discussion   Elijio MilesMercer, Doyl Bitting N 07/06/2013, 5:22 PM

## 2013-07-06 NOTE — Progress Notes (Signed)
Psychoeducational Group Note  Psychoeducational Group Note  Date: 07/06/2013 Time: 0930 Group Topic/Focus:  Gratefulness:  The focus of this group is to help patients identify what two things they are most grateful for in their lives. What helps ground them and to center them on their work to their recovery.  Participation Level:  Active  Participation Quality:  Appropriate  Affect:  Appropriate  Cognitive:  Oriented  Insight:  Improving  Engagement in Group:  Engaged  Additional Comments:    Dione HousekeeperJudge, Jammy Stlouis A

## 2013-07-06 NOTE — Progress Notes (Signed)
Adult Psychoeducational Group Note  Date:  07/06/2013 Time:  9:48 PM  Group Topic/Focus:  Wrap-Up Group:   The focus of this group is to help patients review their daily goal of treatment and discuss progress on daily workbooks.  Participation Level:  Active  Participation Quality:  Appropriate  Affect:  Appropriate  Cognitive:  Appropriate  Insight: Appropriate  Engagement in Group:  Engaged  Modes of Intervention:  Discussion  Additional Comments:  The patient expressed that  In group to believe in herself. The patient also said that she learned to think before she feels.  Octavio Mannshigpen, Alonni Heimsoth Lee 07/06/2013, 9:48 PM

## 2013-07-06 NOTE — Progress Notes (Signed)
Patient ID: Carol Rogers, female   DOB: 03/31/1991, 23 y.o.   MRN: [id number] D)  Has been in the dayroom this evening, interacting appropriately with select peers.  Attended group, participated, stated she was working on learning to start believing in herself, to think first before she starts believing the bad things and focus on the good.  Had a snack and was staying in the dayroom for talk to peers more. A)  Will continue to monitor for safety, continue POC, support, encouragement. R)  Safety maintained.

## 2013-07-06 NOTE — Progress Notes (Signed)
Patient ID: Carol Rogers, female   DOB: 07/12/1990, 23 y.o.   MRN: [id number] D)  Has been bright, pleasant, interacting appropriately with staff and select peers.  States feeling better this evening, but requested albuterol inhaler as she was feeling some tightness with her breathing.  States she has many food allergies and needs to keep benadryl nearby as she sometimes has eaten things that she was allergic to and has had to take it.  States is lactose intolerant , unable to eat the puddings, [facility], hoping there will be some for her cereal in the morning, also has issues with gluten. Voiced interest in dietary consult while here.  States misses her family but has been laughing and talking with other females, getting along. A)  Will request dietary consult, continue to monitor for safety, continue POC R)  Safety maintained.
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
## 2013-07-06 NOTE — Progress Notes (Signed)
Psychoeducational Group Note  Date:  07/06/2013 Time:  1015  Group Topic/Focus:  Making Healthy Choices:   The focus of this group is to help patients identify negative/unhealthy choices they were using prior to admission and identify positive/healthier coping strategies to replace them upon discharge.  Participation Level:  Active  Participation Quality:  Appropriate  Affect:  Appropriate  Cognitive:  Oriented  Insight:  Improving  Engagement in Group:  Engaged  Additional Comments:    Kataryna Mcquilkin A 07/06/2013 

## 2013-07-06 NOTE — Progress Notes (Signed)
[facility] MD Progress Note  07/06/2013 4:52 PM Carol Rogers  MRN:  [id number] Subjective:  Carol Rogers is a 23 year old female who presents to [facility] after having suicidal thoughts. During today's assessment she states she feels better but tired. She states she is feeling better than she was. She still expresses concerns about increased levels of depression stating that she misses her mom and baby sister, rating 6/10 and her anxiety is at 5/10.  Upon discharge she is hoping to go home, reports family support, although she is afraid of going back home, and ending up with all those people. She is attending groups. Denies any SI/HI/AVH.  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 Exercise-induced asthma   . Acid reflux   . Dysmenorrhea    Axis IV: economic problems, housing problems, occupational problems, other psychosocial or environmental problems, problems related to social environment and problems with primary support group Axis V: 41-50 serious symptoms  ADL's:  Intact  Sleep: fair; vivid nightmares  Appetite:  Good  Suicidal Ideation:  Plan:  denies Intent:  Denies Means:  denies Homicidal Ideation:  Plan:  Denies Intent:  Denies Means:  denies AEB (as evidenced by):  Psychiatric Specialty Exam: Review of Systems  Constitutional: Negative.   Skin: Negative.   Neurological: Positive for tremors.  Psychiatric/Behavioral: Positive for depression. Negative for suicidal ideas, hallucinations, memory loss and substance abuse. The patient is nervous/anxious. The patient does not have insomnia.   All other  systems reviewed and are negative.    Blood pressure 110/72, pulse 101, temperature 98.1 F (36.7 C), temperature source Oral, resp. rate 20, height 5' 4"  (1.626 m), weight 59.875 kg (132 lb), last menstrual period 02/10/2013, SpO2 99.00%.Body mass index is 22.65 kg/(m^2).  General Appearance: Neat and Well Groomed  Engineer, water::  Good  Speech:  Normal Rate  Volume:  Normal  Mood:  Euthymic  Affect:  Appropriate  Thought Process:  Coherent  Orientation:  Full (Time, Place, and Person)  Thought Content:  WDL  Suicidal Thoughts:  No  Homicidal Thoughts:  No  Memory:  Immediate;   Good Recent;   Good Remote;   Good  Judgement:  Fair  Insight:  Good  Psychomotor Activity:  Normal  Concentration:  Good  Recall:  Good  Akathisia:  No  Handed:  Right  AIMS (if indicated):     Assets:  Communication Skills Desire for Improvement Leisure Time Social Support  Sleep:  Number of Hours: 6.5   Current Medications: Current Facility-Administered Medications  Medication Dose Route Frequency Provider Last Rate Last Dose  . acetaminophen (TYLENOL) tablet 650 mg  650 mg Oral Q6H PRN Benjamine Mola, FNP   650 mg at 07/04/13 2308  . albuterol (PROVENTIL HFA;VENTOLIN HFA) 108 (90 BASE) MCG/ACT inhaler 2 puff  2 puff Inhalation Q6H PRN Lurena Nida, NP   2 puff at 07/05/13 2142  . alum & mag hydroxide-simeth (MAALOX/MYLANTA) 200-200-20 MG/5ML suspension 30 mL  30 mL Oral Q4H PRN Benjamine Mola, FNP      . buPROPion (WELLBUTRIN XL) 24 hr tablet 150 mg  150 mg Oral Daily Durward Parcel, MD   150 mg  at 07/06/13 0801  . busPIRone (BUSPAR) tablet 7.5 mg  7.5 mg Oral BID Durward Parcel, MD   7.5 mg at 07/06/13 0801  . diphenhydrAMINE (BENADRYL) capsule 25 mg  25 mg Oral Q6H PRN Lurena Nida, NP      . hydrOXYzine (ATARAX/VISTARIL) tablet 25 mg  25 mg Oral Q6H PRN Benjamine Mola, FNP   25 mg at 07/06/13 1544  . loratadine (CLARITIN) tablet 10 mg  10 mg Oral Daily Benjamine Mola,  FNP   10 mg at 07/06/13 0801  . magnesium hydroxide (MILK OF MAGNESIA) suspension 30 mL  30 mL Oral Daily PRN Benjamine Mola, FNP      . norethindrone-ethinyl estradiol (MICROGESTIN,JUNEL,LOESTRIN) 1-20 MG-MCG tablet 1 tablet  1 tablet Oral QHS Hampton Abbot, MD   1 tablet at 07/05/13 2141    Lab Results:  Results for orders placed during the hospital encounter of 07/03/13 (from the past 48 hour(s))  CBC WITH DIFFERENTIAL     Status: None   Collection Time    07/04/13  7:41 PM      Result Value Range   WBC 5.6  4.0 - 10.5 K/uL   RBC 4.40  3.87 - 5.11 MIL/uL   Hemoglobin 13.1  12.0 - 15.0 g/dL   HCT 39.1  36.0 - 46.0 %   MCV 88.9  78.0 - 100.0 fL   MCH 29.8  26.0 - 34.0 pg   MCHC 33.5  30.0 - 36.0 g/dL   RDW 12.0  11.5 - 15.5 %   Platelets 271  150 - 400 K/uL   Neutrophils Relative % 52  43 - 77 %   Neutro Abs 2.9  1.7 - 7.7 K/uL   Lymphocytes Relative 40  12 - 46 %   Lymphs Abs 2.2  0.7 - 4.0 K/uL   Monocytes Relative 7  3 - 12 %   Monocytes Absolute 0.4  0.1 - 1.0 K/uL   Eosinophils Relative 1  0 - 5 %   Eosinophils Absolute 0.1  0.0 - 0.7 K/uL   Basophils Relative 0  0 - 1 %   Basophils Absolute 0.0  0.0 - 0.1 K/uL   Comment: Performed at [place]     Status: None   Collection Time    07/04/13  7:41 PM      Result Value Range   Sodium 140  137 - 147 mEq/L   Potassium 4.6  3.7 - 5.3 mEq/L   Chloride 102  96 - 112 mEq/L   CO2 26  19 - 32 mEq/L   Glucose, Bld 99  70 - 99 mg/dL   BUN 7  6 - 23 mg/dL   Creatinine, Ser 0.64  0.50 - 1.10 mg/dL   Calcium 9.5  8.4 - 10.5 mg/dL   Total Protein 7.9  6.0 - 8.3 g/dL   Albumin 4.0  3.5 - 5.2 g/dL   AST 22  0 - 37 U/L   Comment: SLIGHT HEMOLYSIS     HEMOLYSIS AT THIS LEVEL MAY AFFECT RESULT   ALT 12  0 - 35 U/L   Alkaline Phosphatase 58  39 - 117 U/L   Total Bilirubin 0.3  0.3 - 1.2 mg/dL   GFR calc non Af Amer >90  >90 mL/min   GFR calc Af Amer >90  >90 mL/min   Comment:  (NOTE)     The eGFR has been calculated using the CKD EPI  equation.     This calculation has not been validated in all clinical situations.     eGFR's persistently <90 mL/min signify possible Chronic Kidney     Disease.     Performed at [facility]  LIPID PANEL     Status: None   Collection Time    07/04/13  7:41 PM      Result Value Range   Cholesterol 162  0 - 200 mg/dL   Triglycerides 68  <150 mg/dL   HDL 66  >39 mg/dL   Total CHOL/HDL Ratio 2.5     VLDL 14  0 - 40 mg/dL   LDL Cholesterol 82  0 - 99 mg/dL   Comment:            Total Cholesterol/HDL:CHD Risk     Coronary Heart Disease Risk Table                         Men   Women      1/2 Average Risk   3.4   3.3      Average Risk       5.0   4.4      2 X Average Risk   9.6   7.1      3 X Average Risk  23.4   11.0                Use the calculated Patient Ratio     above and the CHD Risk Table     to determine the patient's CHD Risk.                ATP III CLASSIFICATION (LDL):      <100     mg/dL   Optimal      100-129  mg/dL   Near or Above                        Optimal      130-159  mg/dL   Borderline      160-189  mg/dL   High      >190     mg/dL   Very High     Performed at [facility]  TSH     Status: None   Collection Time    07/04/13  7:41 PM      Result Value Range   TSH 2.356  0.350 - 4.500 uIU/mL   Comment: Performed at [facility]    Physical Findings: AIMS: Facial and Oral Movements Muscles of Facial Expression: None, normal Lips and Perioral Area: None, normal Jaw: None, normal Tongue: None, normal,Extremity Movements Upper (arms, wrists, hands, fingers): None, normal Lower (legs, knees, ankles, toes): None, normal, Trunk Movements Neck, shoulders, hips: None, normal, Overall Severity Severity of abnormal movements (highest score from questions above): None, normal Incapacitation due to abnormal movements: None, normal Patient's awareness of abnormal  movements (rate only patient's report): No Awareness, Dental Status Current problems with teeth and/or dentures?: No Does patient usually wear dentures?: No  CIWA:  CIWA-Ar Total: 4 COWS:  COWS Total Score: 2  Treatment Plan Summary: Daily contact with patient to assess and evaluate symptoms and progress in treatment Medication management  Plan: Plan: Review of chart, vital signs, medications, and notes.  1-Admit for crisis management and stabilization. Estimated length of stay 5-7 days past her current stay of 1  2-Individual and group therapy encouraged  3-Medication  management for depression and anxiety to reduce current symptoms to base line and improve the patient's overall level of functioning: Medications reviewed with the patient and Trazodone added for sleep issues and Vistaril 25 mg every six hours PRN anxiety  4-Coping skills for depression, substance abuse, anger issues, and anxiety developing--  5-Continue crisis stabilization and management  6-Address health issues--monitoring vital signs, stable  7-Treatment plan in progress to prevent relapse of depression, angry outbursts, and anxiety  8-Psychosocial education regarding relapse prevention and self-care  9-Health care follow up as needed for any health concerns  10-Call for consult with hospitalist for additional specialty patient services as needed.   Medical Decision Making Problem Points:  Established problem, stable/improving (1) and Review of psycho-social stressors (1) Data Points:  Discuss tests with performing physician (1) Review or order clinical lab tests (1) Review of medication regiment & side effects (2) Review of new medications or change in dosage (2)  I certify that inpatient services furnished can reasonably be expected to improve the patient's condition.   Priscille Loveless S 07/06/2013, 4:52 PM

## 2013-07-06 NOTE — [facility] Group Notes (Signed)
[facility] LCSW Group Therapy Note   07/06/2013  1:15 To 2:15 PM   Type of Therapy and Topic: Group Therapy: Feelings Around Returning Home & Establishing a Supportive Framework and Activity to Identify signs of Improvement or Decompensation   Participation Level: Active although called out for half of group to meet with medical staff  Mood:  Appropriate  Description of Group:  Patients first processed thoughts and feelings about up coming discharge. These included fears of upcoming changes, lack of change, new living environments, judgements and expectations from others and overall stigma of MH issues. We then discussed what is a supportive framework? What does it look like feel like and how do I discern it from and unhealthy non-supportive network? Learn how to cope when supports are not helpful and don't support you. Discuss what to do when your family/friends are not supportive.   Therapeutic Goals Addressed in Processing Group:  1. Patient will identify one healthy supportive network that they can use at discharge. 2. Patient will identify one factor of a supportive framework and how to tell it from an unhealthy network. 3. Patient able to identify one coping skill to use when they do not have positive supports from others. 4. Patient will demonstrate ability to communicate their needs through discussion and/or role plays.  Summary of Patient Progress:  Pt engaged easily during group session. She processed her anxiety about discharge and described desire not to fall back into isolation and despair.  She shared need to establish some accountability with her supports and is open to contacting a close friend about checking in with her. Patient open to concept of using HALT tool 4-5 times per day.    Carney Bernatherine C Harrill, LCSW

## 2013-07-07 MED ORDER — PRAZOSIN HCL 1 MG PO CAPS
1.0000 mg | ORAL_CAPSULE | Freq: Every day | ORAL | Status: DC
  Administered 2013-07-07: 1 mg via ORAL
  Filled 2013-07-07 (×3): qty 1

## 2013-07-07 NOTE — [facility] Group Notes (Signed)
[facility] LCSW Group Therapy          Overcoming Obstacles       1:15 -2:30        07/07/2013   3:41 PM     Type of Therapy:  Group Therapy  Participation Level:  Appropriate  Participation Quality:  Appropriate  Affect:  Appropriate, Alert  Cognitive:  Attentive Appropriate  Insight: Developing/Improving Engaged  Engagement in Therapy: Developing/Imprvoing Engaged  Modes of Intervention:  Discussion Exploration  Education Rapport BuildingProblem-Solving Support  Summary of Progress/Problems:  The main focus of today's group was overcoming obstacles.  Patient shared the obstacle she has to overcome is not feeling that she is good enough and will never be good enough for others. She talked also about having to confront negative thinking.  Patient shared being here has help her to understand she is good enough and that she is a great person with a good future ahead of her.  She shared one of the problems she is dealing with at home is having seven people living in a two bedroom apartment and having to share a twin bed with an adult cousin.  Patient able to identify appropriate coping skills such as taking a drive in order to have a few minutes of privacy.   Wynn BankerHodnett, Berlie Hatchel Hairston 07/07/2013    3:41 PM

## 2013-07-07 NOTE — Progress Notes (Signed)
Adult Psychoeducational Group Note  Date:  07/07/2013 Time:  10:44 PM  Group Topic/Focus:  Goals Group:   The focus of this group is to help patients establish daily goals to achieve during treatment and discuss how the patient can incorporate goal setting into their daily lives to aide in recovery.  Participation Level:  Active  Participation Quality:  Appropriate  Affect:  Appropriate  Cognitive:  Appropriate  Insight: Appropriate  Engagement in Group:  Engaged  Modes of Intervention:  Discussion  Additional Comments:  Pt stated that she is scared because she doesn't know what's going to happen tomorrow.   Terie PurserParker, Osborn Pullin R 07/07/2013, 10:44 PM

## 2013-07-07 NOTE — Tx Team (Signed)
Interdisciplinary Treatment Plan Update   Date Reviewed:  07/07/2013  Time Reviewed:  9:43 AM  Progress in Treatment:   Attending groups: Yes Participating in groups: Yes Taking medication as prescribed: Yes  Tolerating medication: Yes Family/Significant other contact made: Yes, collateral contact with mom Patient understands diagnosis: Yes  Discussing patient identified problems/goals with staff: Yes Medical problems stabilized or resolved: Yes Denies suicidal/homicidal ideation: Yes Patient has not harmed self or others: Yes  For review of initial/current patient goals, please see plan of care.  Estimated Length of Stay: 1 day  Reasons for Continued Hospitalization:  Anxiety Depression Medication stabilization  New Problems/Goals identified:    Discharge Plan or Barriers:   Home [facility] and    Additional Comments:     Attendees:  Patient:  07/07/2013 9:43 AM   Signature: Mervyn GayJ. Jonnalagadda, MD 07/07/2013 9:43 AM  Signature:  Neill Loftarol Davis, RN  07/07/2013 9:43 AM  Signature:  Claudette Headonrad Withrow, NP 07/07/2013 9:43 AM  Signature:  Quintella ReichertBeverly Knight, RN 07/07/2013 9:43 AM  Signature:   07/07/2013 9:43 AM  Signature:  Juline PatchQuylle Laniece Hornbaker, LCSW 07/07/2013 9:43 AM  Signature:  Reyes Ivanhelsea Horton, LCSW 07/07/2013 9:43 AM  Signature:  Leisa LenzValerie Enoch, Care Coordinator 07/07/2013 9:43 AM  Signature:  07/07/2013 9:43 AM  Signature 07/07/2013  9:43 AM  Signature:   Onnie BoerJennifer Clark, RN [facility] 07/07/2013  9:43 AM  Signature:   07/07/2013  9:43 AM    Scribe for Treatment Team:   Juline PatchQuylle Jaimya Feliciano,  07/07/2013 9:43 AM

## 2013-07-07 NOTE — Progress Notes (Signed)
Pt attended spiritual care group on grief and loss facilitated by chaplain Burnis KingfisherMatthew Tamiyah Moulin.  Group opened with brief discussion and psycho-social ed around grief and loss in relationships and in relation to self - identifying life patterns, circumstances, changes that cause losses. Established group norm of speaking from own life experience. Group goal of establishing open and affirming space for members to share loss and experience with grief, normalize grief experience and provide psycho social education and grief support.   Marcia BrashKylee was present and attentive in group.  Affect was appropriate.  Did not contribute to group discussion.   Belva CromeStalnaker, Arcangel Minion Wayne MDiv

## 2013-07-07 NOTE — Progress Notes (Signed)
Recreation Therapy Notes  Date: 01.26.2015 Time: 2:45pm Location: [address] Dayroom   Group Topic: Wellness  Goal Area(s) Addresses:  Patient will identify dimension of wellness they most struggle with.  Patient will identify at least 2 ways to invest in that type of wellness.   Behavioral Response: Engaged, Attentive.    Intervention: Informational worksheet  Activity: Patients were provided a worksheet outlining the 6 dimensions of wellness - Physical, Emotional, Social, Emotional, Environmental, & Spiritual.  As a group patients were asked to identify examples of each dimension, individually patients were asked to identify at least 2 things they can do post d/c to invest in each dimension of wellness.   Education: Wellness, Discharge Planning  Education Outcome: Acknowledges understanding   Clinical Observations/Feedback: Patient actively engaged in activity, from both group and individual perspective. Patient made no contributions to group discussion, but appeared to actively listen as she maintained appropriate eye contact with speaker.   Marykay Lexenise L Donelda Mailhot, LRT/CTRS  Jearl KlinefelterBlanchfield, Marlen Mollica L 07/07/2013 4:42 PM

## 2013-07-07 NOTE — Clinical Social Work Note (Signed)
Writer spoke with patient's mother and outpatient therapist who were requesting a family session be held with patient and those in the household to confront family problems.  Mother shared there are seven people living in the home sharing beds.  She shared there are no concerns with sexual or physical abuse just how they can all share the space and get along.  She advised when there are problems that need to be confronted patient will usually end up in the hospital.  Therapist Barnet Pall, [facility] stated she had met with the family over the weekend and wanted all to come to hospital where patient would have to deal with family issues. They were both advised that writer would take their concerns to MD and call them back.  Writer spoke with MD who advised he does not believe confronting patient in the hospital would be the best way of handling the problem  He advised patient is here to heal and get better and if confronted by multiple people it could lead to her decompensating and having to remain in the hospital.   Writer advised mother and therapist of the decision.  Mother was advised we can address handling confrontation in the group therapy session.

## 2013-07-07 NOTE — [facility] Group Notes (Signed)
[facility]   07/07/2013 10:47 AM    Participation Quality:  Appropraite  Mood/Affect:  Appropriate  Depression Rating:  2  Anxiety Rating:  3  Thoughts of Suicide:  No  Will you contract for safety?   NA  Current AVH:  No  Plan for Discharge/Comments:  Patient attended discharge planning group and actively participated in group.  She reports doing much better she will follow up with Ginette OttoMandie Overocker for counseling and [facility] for medication management.  CSW provided all participants with daily workbook.  Transportation Means: Patient has transportation.   Supports:  Patient has a support system.   Clarence Cogswell, Joesph JulyQuylle Hairston

## 2013-07-07 NOTE — Progress Notes (Signed)
Pt observed in the dayroom talking with peers.  Pt reports that she is having a good day.  She feels she may be discharged home tomorrow and says she feels ready.  She only has c/o abd gas for which she is given Maalox at this time.  She also asked about minipress she received last night for nightmares.  Upon checking pt's chart, found that the minipress was a one time order.  Pt states it did help.  Informed pt that writer would speak to PA to see if it could be ordered for tonight.  Pt appreciative.  Pt denies SI/HI/AV.  Pt makes her needs known to staff.  Support and encouragement offered.  Safety maintained with q15 minute checks.

## 2013-07-07 NOTE — [facility] Suicide Risk Assessment (Addendum)
[facility] INPATIENT:  Family/Significant Other Suicide Prevention Education  Suicide Prevention Education:  Education Completed:  Lutricia FeilVictoria Bartleson, Mother, [phone]; has been identified by the patient as the family member/significant other with whom the patient will be residing, and identified as the person(s) who will aid the patient in the event of a mental health crisis (suicidal ideations/suicide attempt).  With written consent from the patient, the family member/significant other has been provided the following suicide prevention education, prior to the and/or following the discharge of the patient.  The suicide prevention education provided includes the following:  Suicide risk factors  Suicide prevention and interventions  National Suicide Hotline telephone number  [facility] assessment telephone number  [facility] Emergency Assistance 911  [facility] and/or [facility]idential [facility] telephone number  Request made of family/significant other to:  Remove weapons (e.g., guns, rifles, knives), all items previously/currently identified as safety concern.  Mother advised patient does not have access to weapons.    Remove drugs/medications (over-the-counter, prescriptions, illicit drugs), all items previously/currently identified as a safety concern.  The family member/significant other verbalizes understanding of the suicide prevention education information provided.  The family member/significant other agrees to remove the items of safety concern listed above.  Wynn BankerHodnett, Osiel Stick Hairston 07/07/2013, 12:57 PM

## 2013-07-07 NOTE — Progress Notes (Signed)
Patient ID: Carol Rogers, female   DOB: 04-04-1991, 23 y.o.   MRN: [id number] [facility] MD Progress Note  07/07/2013 12:24 PM Carol Rogers  MRN:  [id number]  Subjective:  Carol Rogers has been suffering with major depressive disorder and generalized anxiety disorder with suicidal thoughts on admission. Patient reported she has been compliant with her medication Wellbutrin which is helping her much better than previous medication. Patient reported she is less anxious than admission. Patient has been minimizing her symptoms of depression and anxiety today. Patient mother wants a family session to confront the patient regarding inability to adjust with  multiple family members in a limited space. Patient has lost her job and has no income and also has multiple psychosocial stressors related to interpersonal relationship problems but the family members. Patient has been feeling depressed now  because she misses her mom and baby sister, rating  3/10 and her anxiety is at  for /10.  Upon discharge she is hoping to go home, reports family support, although she is afraid of going back home, and ending up with all those people. Patient Denies Current Suicidal Ideations intentions or plans and endorses safety contract.   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 Exercise-induced asthma   . Acid reflux   . Dysmenorrhea    Axis IV: economic problems, housing problems, occupational problems, other psychosocial or environmental problems, problems related to social environment and problems with primary support group Axis V: 41-50  serious symptoms  ADL's:  Intact  Sleep: fair; vivid nightmares  Appetite:  Good  Suicidal Ideation:  Plan:  denies Intent:  Denies Means:  denies Homicidal Ideation:  Plan:  Denies Intent:  Denies Means:  denies AEB (as evidenced by):  Psychiatric Specialty Exam: Review of Systems  Constitutional: Negative.   Skin: Negative.   Neurological: Positive for tremors.  Psychiatric/Behavioral: Positive for depression. Negative for suicidal ideas, hallucinations, memory loss and substance abuse. The patient is nervous/anxious. The patient does not have insomnia.   All other systems reviewed and are negative.    Blood pressure 102/66, pulse 111, temperature 98.1 F (36.7 C), temperature source Oral, resp. rate 20, height 5\' 4"  (1.626 m), weight 59.875 kg (132 lb), last menstrual period 02/10/2013, SpO2 99.00%.Body mass index is 22.65 kg/(m^2).  General Appearance: Neat and Well Groomed, normal musculoskeletal activity  Eye Contact::  Good  Speech:  Normal Rate, intact language   Volume:  Normal  Mood:  Euthymic  Affect:  Appropriate  Thought Process:  Coherent  Orientation:  Full (Time, Place, and Person)  Thought Content:  WDL  Suicidal Thoughts:  No  Homicidal Thoughts:  No  Memory:  Immediate;   Good Recent;   Good Remote;   Good  Judgement:  Fair  Insight:  Good  Psychomotor Activity:  Normal  Concentration:  Good, fund of knowledge good  Recall:  Good  Akathisia:  No  Handed:  Right  AIMS (if indicated):     Assets:  Communication Skills Desire for Improvement Leisure Time Social Support  Sleep:  Number of Hours: 6.75   Current Medications: Current Facility-Administered Medications  Medication Dose Route Frequency Provider Last Rate Last Dose  . acetaminophen (  TYLENOL) tablet 650 mg  650 mg Oral Q6H PRN Beau Fanny, FNP   650 mg at 07/04/13 2308  . albuterol (PROVENTIL HFA;VENTOLIN HFA) 108 (90 BASE) MCG/ACT inhaler 2 puff  2 puff Inhalation Q6H PRN Kristeen Mans, NP   2 puff at 07/05/13 2142  . alum & mag hydroxide-simeth (MAALOX/MYLANTA) 200-200-20 MG/5ML suspension 30 mL  30 mL Oral Q4H PRN Beau Fanny, FNP      . buPROPion (WELLBUTRIN XL) 24 hr tablet 150 mg  150 mg Oral Daily Nehemiah Settle, MD   150 mg at 07/07/13 0824  . busPIRone (BUSPAR) tablet 7.5 mg  7.5 mg Oral BID Nehemiah Settle, MD   7.5 mg at 07/07/13 0824  . diphenhydrAMINE (BENADRYL) capsule 25 mg  25 mg Oral Q6H PRN Kristeen Mans, NP      . hydrOXYzine (ATARAX/VISTARIL) tablet 25 mg  25 mg Oral Q6H PRN Beau Fanny, FNP   25 mg at 07/06/13 1544  . loratadine (CLARITIN) tablet 10 mg  10 mg Oral Daily Beau Fanny, FNP   10 mg at 07/07/13 1610  . magnesium hydroxide (MILK OF MAGNESIA) suspension 30 mL  30 mL Oral Daily PRN Beau Fanny, FNP      . norethindrone-ethinyl estradiol (MICROGESTIN,JUNEL,LOESTRIN) 1-20 MG-MCG tablet 1 tablet  1 tablet Oral QHS Nelly Rout, MD   1 tablet at 07/06/13 2106    Lab Results:  No results found for this or any previous visit (from the past 48 hour(s)).  Physical Findings: AIMS: Facial and Oral Movements Muscles of Facial Expression: None, normal Lips and Perioral Area: None, normal Jaw: None, normal Tongue: None, normal,Extremity Movements Upper (arms, wrists, hands, fingers): None, normal Lower (legs, knees, ankles, toes): None, normal, Trunk Movements Neck, shoulders, hips: None, normal, Overall Severity Severity of abnormal movements (highest score from questions above): None, normal Incapacitation due to abnormal movements: None, normal Patient's awareness of abnormal movements (rate only patient's report): No Awareness, Dental Status Current problems with teeth and/or dentures?: No Does patient usually wear dentures?: No  CIWA:  CIWA-Ar Total: 4 COWS:  COWS Total Score: 2  Treatment Plan Summary: Daily contact with patient to assess and evaluate symptoms and progress in treatment Medication  management  Plan: Plan: Review of chart, vital signs, medications, and notes.  1-Admit for crisis management and stabilization.  Estimated length of stay 5-7 days   2-Individual and group therapy encouraged  3-Medication management for depression and anxiety to reduce current symptoms to base line and improve the patient's overall level of functioning:  Medications reviewed with the patient Continue Wellbutrin XL 150 milligrams daily for depression Continue Buspar 7.5 mg twice daily for anxiety  Continue hydroxyzine 25 mg every 6 hours as needed for anxiety  4-Coping skills for depression, substance abuse, anger issues, and anxiety developing--  5-Continue crisis stabilization and management  6-Address health issues--monitoring vital signs, stable  7-Treatment plan in progress to prevent relapse of depression, angry outbursts, and anxiety  8-Psychosocial education regarding relapse prevention and self-care  9-Health care follow up as needed for any health concerns  10-Call for consult with hospitalist for additional specialty patient services as needed.   Medical Decision Making Problem Points:  Established problem, stable/improving (1) and Review of psycho-social stressors (1) Data Points:  Discuss tests with performing physician (1) Review or order clinical lab tests (1) Review of medication regiment & side effects (2) Review of new medications or change in dosage (2)  I certify that inpatient services furnished can reasonably be expected to improve the patient's condition.   Omya Winfield,JANARDHAHA R. 07/07/2013, 12:24 PM

## 2013-07-07 NOTE — Progress Notes (Signed)
D:  Patient's self inventory sheet, patient sleeps well, good appetite, high energy level, good attention span.  Rated depression 2, hopeless 1, anxiety 3.  Denied withdrawals.  Denied SI.  Denied physical problems.  Rated pain goal 1, worst pain 1.  Plans to "keep a distance from my triggers" after discharge.   A:  Medications administered per MD orders.  Emotional support and encouragement given patient. R:  Denied SI and HI.  Denied A/V hallucinations.  Denied pain.  Will continue to monitor patient for safety with 15 minute checks.  Safety maintained.

## 2013-07-08 MED ORDER — PRAZOSIN HCL 1 MG PO CAPS: 1.0000 mg | ORAL_CAPSULE | Freq: Every day | ORAL | Status: DC

## 2013-07-08 MED ORDER — BUPROPION HCL ER (XL) 150 MG PO TB24: 150.0000 mg | ORAL_TABLET | Freq: Every day | ORAL | Status: DC

## 2013-07-08 MED ORDER — HYDROXYZINE HCL 25 MG PO TABS: 25.0000 mg | ORAL_TABLET | Freq: Four times a day (QID) | ORAL | Status: DC | PRN

## 2013-07-08 MED ORDER — BUSPIRONE HCL 7.5 MG PO TABS: 7.5000 mg | ORAL_TABLET | Freq: Two times a day (BID) | ORAL | Status: DC

## 2013-07-08 NOTE — Progress Notes (Signed)
[facility] :  Will you be returning to the same living situation after discharge: Yes,  Patient is returning to her mother's home. At discharge, do you have transportation home?:Yes,  Mother to transport patient home. Do you have the ability to pay for your medications:No.  Patient will need assistance with indigent medications.  Release of information consent forms completed and in the chart;  Patient's signature needed at discharge.  Patient to Follow up at: Follow-up Information   Follow up with Ginette OttoMandie Overocker, LPC On 07/10/2013. (Thursday, July 10, 2013 at [facility])    Contact information:   [address] [place], [place]   [id number]  [phone]      Follow up with [facility] On 07/09/2013. (Please go to [facility]'s walk in clinic on Wednesday, July 09, 2013 or any weekday between 8Am -5PM)    Contact information:   [address] [place], [place]   [phone]      Patient denies SI/HI:  Patient no longer endorsing SI/HI or other thoughts of self harm.  Safety Planning and Suicide Prevention discussed: .Reviewed with all patients during discharge planning group  Carol Rogers, Carol JulyQuylle Rogers 07/08/2013, 12:50 PM

## 2013-07-08 NOTE — Progress Notes (Signed)
The focus of this group is to educate the patient on the purpose and policies of crisis stabilization and provide a format to answer questions about their admission.  The group details unit policies and expectations of patients while admitted.  Patient attended 0900 nurse education orientation group this morning.  Patient listened attentively, appropriate affect, alert, appropriate insight and engagement.  Today patient will work on 3 goals for discharge.  

## 2013-07-08 NOTE — Progress Notes (Signed)
D:  Patient's self inventory sheet, patient sleeps well, good appetite, normal energy level, good attention span.  Rated depression, hopelees, anxiety #1.  Denied withdrawals.  Denied SI.  Denied physical problems.  "I'm not going to pick up other people's words."  Ready to discharge home.  No problems taking meds after discharge. A:  Medications administered per MD orders.  Emotional support and encouragement given patient. R:  Denied SI and HI.  Denied A/V hallucinations.  Denied pain.  Will continue 15 minute checks for safety.  Safety maintained.

## 2013-07-08 NOTE — Discharge Summary (Signed)
Physician Discharge Summary Note  Patient:  Carol Rogers is an 23 y.o., female MRN:  [id number] DOB:  07-22-1990 Patient phone:  [phone] (home)  Patient address:   [address]  [address] [place] [address],   Date of Admission:  07/03/2013 Date of Discharge: 07/08/2013  Reason for Admission:  Major depression, recurrent, severe.  Discharge Diagnoses: Active Problems:   Major depression, chronic   Major depression   Generalized anxiety disorder  Review of Systems  Constitutional: Negative.   HENT: Negative.   Eyes: Negative.   Respiratory: Negative.   Cardiovascular: Negative.   Gastrointestinal: Negative.   Genitourinary: Negative.   Musculoskeletal: Negative.   Skin: Negative.   Neurological: Negative.   Endo/Heme/Allergies: Negative.   Psychiatric/Behavioral: Negative.     DSM5:  Depressive Disorders:  Major Depressive Disorder - Severe (296.23)  Axis Diagnosis:   AXIS I:  Generalized Anxiety Disorder and Major Depression, Recurrent severe AXIS II:  Deferred AXIS III:   Past Medical History  Diagnosis Date  . Exercise-induced asthma   . Acid reflux   . Dysmenorrhea    AXIS IV:  other psychosocial or environmental problems and problems related to social environment AXIS V:  61-70 mild symptoms  Level of Care:  OP  Hospital Course:   Carol Rogers is an 23 y.o. African American, unemployed female admitted voluntarily from [facility] assessment after presented as a walk in with her mother, mother's partner, and therapist-Mandi [employer] executive for increased symptoms of depression, anxiety and suicidal ideation. Patient is suicidal with a plan to run her car off a wrong. Says that when she drives she has these impulsive thoughts. Patient's suicidal ideations have been on/off since age 20. However, over the past 2-3 weeks has worsened. She is afraid to be alone and feels that she is unable to contract for safety. She told her mother and therapist this morning that  she couldn't make any promises to keep herself safe.She denies prior suicide attempts. Her depression is triggered by her current living arrangements. Sts she lives in a 2 bedroom household with 7 people. She feels uncomfortable in the home. She shares a twin bed with another 22 yr old female. Sts, "Sometimes I sit in the closet just to get away from the people I live with". She reports increased anxiety with daily panic attacks. She is calm and cooperative. No HI. No history of violent behaviors. No current AVH's. However, has a past history of seeing cats. She denies drug and alcohol use. No history of inpatient hospitalizations. She has her therapist Clent Ridges that she see's on a regular basis. No current psychiatrist, however; her PCP prescribes her anti-depressants   During Hospitalization: Medications managed, psychoeducation, group and individual therapy. Pt currently denies SI, HI, and Psychosis. At discharge, pt minimizes depression and anxiety. Pt states that she does have a good supportive home environment and will followup with outpatient treatment. Affirms agreement with medication regimen and discharge plan. Denies other physical and psychological concerns at time of discharge.   Consults:  None  Significant Diagnostic Studies:  None  Discharge Vitals:   Blood pressure 111/77, pulse 109, temperature 98.5 F (36.9 C), temperature source Oral, resp. rate 16, height 5\' 4"  (1.626 m), weight 59.875 kg (132 lb), last menstrual period 02/10/2013, SpO2 99.00%. Body mass index is 22.65 kg/(m^2). Lab Results:   No results found for this or any previous visit (from the past 72 hour(s)).  Physical Findings: AIMS: Facial and Oral Movements Muscles of Facial  Expression: None, normal Lips and Perioral Area: None, normal Jaw: None, normal Tongue: None, normal,Extremity Movements Upper (arms, wrists, hands, fingers): None, normal Lower (legs, knees, ankles, toes): None, normal, Trunk  Movements Neck, shoulders, hips: None, normal, Overall Severity Severity of abnormal movements (highest score from questions above): None, normal Incapacitation due to abnormal movements: None, normal Patient's awareness of abnormal movements (rate only patient's report): No Awareness, Dental Status Current problems with teeth and/or dentures?: No Does patient usually wear dentures?: No  CIWA:  CIWA-Ar Total: 1 COWS:  COWS Total Score: 2  Psychiatric Specialty Exam: See Psychiatric Specialty Exam and Suicide Risk Assessment completed by Attending Physician prior to discharge.  Discharge destination:  Home  Is patient on multiple antipsychotic therapies at discharge:  No   Has Patient had three or more failed trials of antipsychotic monotherapy by history:  No  Recommended Plan for Multiple Antipsychotic Therapies: NA     Medication List    STOP taking these medications       diphenhydrAMINE 25 mg capsule  Commonly known as:  BENADRYL     escitalopram 5 MG tablet  Commonly known as:  LEXAPRO      TAKE these medications     Indication   albuterol 108 (90 BASE) MCG/ACT inhaler  Commonly known as:  PROVENTIL HFA;VENTOLIN HFA  Inhale 2 puffs into the lungs every 6 (six) hours as needed for wheezing or shortness of breath.      buPROPion 150 MG 24 hr tablet  Commonly known as:  WELLBUTRIN XL  Take 1 tablet (150 mg total) by mouth daily.   Indication:  mood stabilization     busPIRone 7.5 MG tablet  Commonly known as:  BUSPAR  Take 1 tablet (7.5 mg total) by mouth 2 (two) times daily.   Indication:  mood stabilization     cetirizine 10 MG tablet  Commonly known as:  ZYRTEC  Take 10 mg by mouth daily.      hydrOXYzine 25 MG tablet  Commonly known as:  ATARAX/VISTARIL  Take 1 tablet (25 mg total) by mouth every 6 (six) hours as needed for anxiety (sleep).   Indication:  anxiety     norethindrone-ethinyl estradiol 1-20 MG-MCG tablet  Commonly known as:   MICROGESTIN,JUNEL,LOESTRIN  Take 1 tablet by mouth daily.      prazosin 1 MG capsule  Commonly known as:  MINIPRESS  Take 1 capsule (1 mg total) by mouth at bedtime.   Indication:  insomnia           Follow-up Information   Follow up with Ginette OttoMandie Overocker, LPC On 07/10/2013. (Thursday, July 10, 2013 at [facility]3PM)    Contact information:   [address] [place], [place]   [id number]  [phone]      Follow up with [facility] On 07/09/2013. (Please go to [facility]'s walk in clinic on Wednesday, July 09, 2013 or any weekday between 8Am -5PM)    Contact information:   [address] [place], [place]   [phone]      Follow-up recommendations:  Activity:  As tolerated. Diet:  Heart healthy with low sodium.  Comments:  * Take all medications as prescribed. Keep all follow-up appointments as scheduled.  Do not consume alcohol or use illegal drugs while on prescription medications. Report any adverse effects from your medications to your primary care provider promptly.  In the event of recurrent symptoms or worsening symptoms, call 911, a crisis hotline, or go to the nearest emergency  department for evaluation.  Total Discharge Time:  Greater than 30 minutes.  Signed: Beau Fanny, FNP-BC 07/08/2013, 11:40 AM  Patient was evaluated for psychiatric evaluation, she said risk assessment and case discussed with the treatment team. Made disposition and plan and reviewed the information documented and agree with the treatment plan.  Jamille Fisher,JANARDHAHA R. 07/08/2013 2:06 PM

## 2013-07-08 NOTE — Progress Notes (Signed)
Discharge Note:  Patient discharged home with family member.  Denied SI and HI.  Denied A/V hallucinations.  Denied pain.  Suicide prevention information given and discussed with patient who stated she understood and had no questions.  Patient stated she received all her belongings, clothing, miscellaneous items, toiletries.  Patient stated [facility].

## 2013-07-08 NOTE — [facility] Suicide Risk Assessment (Signed)
Suicide Risk Assessment  Discharge Assessment     Demographic Factors:  Adolescent or young adult, Low socioeconomic status and Unemployed  Mental Status Per Nursing Assessment::   On Admission:  Suicidal ideation indicated by patient  Current Mental Status by Physician: Patient is calm and cooperative. Patient has normal psychomotor activity. Patient has a good mood with the appropriate he brightens when affect. Patient has normal speech and thought process. Patient has no suicidal, homicidal ideation, intention or plan. Patient has no evidence of psychotic symptoms. Patient has fair insight, judgment and impulse control.  Loss Factors: Financial problems/change in socioeconomic status  Historical Factors: Impulsivity and Multiple psychosocial stressors  Risk Reduction Factors:   Sense of responsibility to family, Religious beliefs about death, Living with another person, especially a relative, Positive social support, Positive therapeutic relationship and Positive coping skills or problem solving skills  Continued Clinical Symptoms:  Severe Anxiety and/or Agitation Depression:   Recent sense of peace/wellbeing Unstable or Poor Therapeutic Relationship  Cognitive Features That Contribute To Risk:  Polarized thinking    Suicide Risk:  Minimal: No identifiable suicidal ideation.  Patients presenting with no risk factors but with morbid ruminations; may be classified as minimal risk based on the severity of the depressive symptoms  Discharge Diagnoses:   AXIS I:  Generalized Anxiety Disorder and Major Depression, single episode AXIS II:  Deferred AXIS III:   Past Medical History  Diagnosis Date  . Exercise-induced asthma   . Acid reflux   . Dysmenorrhea    AXIS IV:  economic problems, occupational problems, other psychosocial or environmental problems, problems related to social environment and problems with primary support group AXIS V:  61-70 mild symptoms  Plan Of  Care/Follow-up recommendations:  Activity:  As tolerated Diet:  Regular  Is patient on multiple antipsychotic therapies at discharge:  No   Has Patient had three or more failed trials of antipsychotic monotherapy by history:  No  Recommended Plan for Multiple Antipsychotic Therapies: NA  Arlyne Brandes,JANARDHAHA R. 07/08/2013, 12:12 PM

## 2013-07-11 NOTE — Progress Notes (Signed)
Patient Discharge Instructions:  After Visit Summary (AVS):   Faxed to:  07/11/13 Discharge Summary Note:   Faxed to:  07/11/13 Psychiatric Admission Assessment Note:   Faxed to:  07/11/13 Suicide Risk Assessment - Discharge Assessment:   Faxed to:  07/11/13 Faxed/Sent to the Next Level Care provider:  07/11/13 Faxed to Sherryll BurgerMande Overocker, LPC @ [phone] Faxed to [facility] @ [phone]  Jerelene ReddenSheena E [place], 07/11/2013, 2:34 PM

## 2013-07-19 ENCOUNTER — Emergency Department (HOSPITAL_COMMUNITY)
Admission: EM | Admit: 2013-07-19 | Discharge: 2013-07-19 | Disposition: A | Discharge status: Home / Self Care | Payer: No Typology Code available for payment source | Attending: Emergency Medicine | Admitting: Emergency Medicine

## 2013-07-19 ENCOUNTER — Encounter (HOSPITAL_COMMUNITY): Payer: Self-pay | Admitting: Emergency Medicine

## 2013-07-19 DIAGNOSIS — Z79899 Other long term (current) drug therapy: Secondary | ICD-10-CM | POA: Insufficient documentation

## 2013-07-19 DIAGNOSIS — Z91040 Latex allergy status: Secondary | ICD-10-CM | POA: Insufficient documentation

## 2013-07-19 DIAGNOSIS — S239XXA Sprain of unspecified parts of thorax, initial encounter: Secondary | ICD-10-CM | POA: Insufficient documentation

## 2013-07-19 DIAGNOSIS — J45990 Exercise induced bronchospasm: Secondary | ICD-10-CM | POA: Insufficient documentation

## 2013-07-19 DIAGNOSIS — Y92410 Unspecified street and highway as the place of occurrence of the external cause: Secondary | ICD-10-CM | POA: Insufficient documentation

## 2013-07-19 DIAGNOSIS — Z8742 Personal history of other diseases of the female genital tract: Secondary | ICD-10-CM | POA: Insufficient documentation

## 2013-07-19 DIAGNOSIS — Y9389 Activity, other specified: Secondary | ICD-10-CM | POA: Insufficient documentation

## 2013-07-19 DIAGNOSIS — T148XXA Other injury of unspecified body region, initial encounter: Secondary | ICD-10-CM

## 2013-07-19 MED ORDER — NAPROXEN 500 MG PO TABS: 500.0000 mg | ORAL_TABLET | Freq: Two times a day (BID) | ORAL | Status: DC

## 2013-07-19 MED ORDER — NAPROXEN 500 MG PO TABS
500.0000 mg | ORAL_TABLET | Freq: Once | ORAL | Status: AC
  Administered 2013-07-19: 500 mg via ORAL
  Filled 2013-07-19: qty 1

## 2013-07-19 MED ORDER — METHOCARBAMOL 500 MG PO TABS: 500.0000 mg | ORAL_TABLET | Freq: Two times a day (BID) | ORAL | Status: DC
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
## 2013-07-19 NOTE — Discharge Instructions (Signed)
Recommend naproxen and Robaxin as prescribed as well as ice to the affected area 4 times a day for at least 30 minutes each time. You may alternate ice and heat packs after 72 hours. Refrain from strenuous activity or heavy lifting.  Muscle Strain A muscle strain is an injury that occurs when a muscle is stretched beyond its normal length. Usually a small number of muscle fibers are torn when this happens. Muscle strain is rated in degrees. First-degree strains have the least amount of muscle fiber tearing and pain. Second-degree and third-degree strains have increasingly more tearing and pain.  Usually, recovery from muscle strain takes 1-2 weeks. Complete healing takes 5 6 weeks.  CAUSES  Muscle strain happens when a sudden, violent force placed on a muscle stretches it too far. This may occur with lifting, sports, or a fall.  RISK FACTORS Muscle strain is especially common in athletes.  SIGNS AND SYMPTOMS At the site of the muscle strain, there may be:  Pain.  Bruising.  Swelling.  Difficulty using the muscle due to pain or lack of normal function. DIAGNOSIS  Your health care provider will perform a physical exam and ask about your medical history. TREATMENT  Often, the best treatment for a muscle strain is resting, icing, and applying cold compresses to the injured area.  HOME CARE INSTRUCTIONS   Use the PRICE method of treatment to promote muscle healing during the first 2 3 days after your injury. The PRICE method involves:  Protecting the muscle from being injured again.  Restricting your activity and resting the injured body part.  Icing your injury. To do this, put ice in a plastic bag. Place a towel between your skin and the bag. Then, apply the ice and leave it on from 15 20 minutes each hour. After the third day, switch to moist heat packs.  Apply compression to the injured area with a splint or elastic bandage. Be careful not to wrap it too tightly. This may interfere  with blood circulation or increase swelling.  Elevate the injured body part above the level of your heart as often as you can.  Only take over-the-counter or prescription medicines for pain, discomfort, or fever as directed by your health care provider.  Warming up prior to exercise helps to prevent future muscle strains. SEEK MEDICAL CARE IF:   You have increasing pain or swelling in the injured area.  You have numbness, tingling, or a significant loss of strength in the injured area. MAKE SURE YOU:   Understand these instructions.  Will watch your condition.  Will get help right away if you are not doing well or get worse. Document Released: 05/29/2005 Document Revised: 03/19/2013 Document Reviewed: 12/26/2012 [facility] Patient Information 2014 [employer] [place].
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
## 2013-07-20 NOTE — ED Provider Notes (Signed)
Medical screening examination/treatment/procedure(s) were performed by non-physician practitioner and as supervising physician I was immediately available for consultation/collaboration.   Nelia Shiobert L Shamicka Inga, MD 07/20/13 1040

## 2019-03-20 ENCOUNTER — Other Ambulatory Visit: Payer: Self-pay

## 2019-03-20 DIAGNOSIS — Z20822 Contact with and (suspected) exposure to covid-19: Secondary | ICD-10-CM

## 2019-03-22 LAB — NOVEL CORONAVIRUS, NAA: SARS-CoV-2, NAA: NOT DETECTED

## 2019-06-18 ENCOUNTER — Ambulatory Visit: Payer: Medicaid Other | Attending: Internal Medicine

## 2019-06-27 ENCOUNTER — Other Ambulatory Visit: Payer: Medicaid Other

## 2019-08-12 ENCOUNTER — Ambulatory Visit (INDEPENDENT_AMBULATORY_CARE_PROVIDER_SITE_OTHER): Payer: BC Managed Care – PPO | Admitting: Family Medicine

## 2019-08-12 ENCOUNTER — Encounter: Payer: Self-pay | Admitting: Family Medicine

## 2019-08-12 ENCOUNTER — Telehealth: Payer: Self-pay | Admitting: Family Medicine

## 2019-08-12 ENCOUNTER — Other Ambulatory Visit: Payer: Self-pay

## 2019-08-12 VITALS — BP 127/79 | HR 94 | Temp 98.7°F | Resp 16 | Ht 64.5 in | Wt 171.0 lb

## 2019-08-12 DIAGNOSIS — F332 Major depressive disorder, recurrent severe without psychotic features: Secondary | ICD-10-CM

## 2019-08-12 DIAGNOSIS — F4310 Post-traumatic stress disorder, unspecified: Secondary | ICD-10-CM | POA: Diagnosis not present

## 2019-08-12 NOTE — Patient Instructions (Addendum)
  Where Should We Begin by Jamelle Haring The Body Keeps the Score by Kennith Maes [employer]     If you have lab work done today you will be contacted with your lab results within the next 2 weeks.  If you have not heard from [place] then please contact us. The fastest way to get your results is to register for My Chart.   IF you received an x-ray today, you will receive an invoice from [facility]. Please contact [facility] at [phone] with questions or concerns regarding your invoice.   IF you received labwork today, you will receive an invoice from [facility]. Please contact [facility] at [phone] with questions or concerns regarding your invoice.   Our billing staff will not be able to assist you with questions regarding bills from these companies.  You will be contacted with the lab results as soon as they are available. The fastest way to get your results is to activate your My Chart account. Instructions are located on the last page of this paperwork. If you have not heard from [place] regarding the results in 2 weeks, please contact this office.

## 2019-08-12 NOTE — Telephone Encounter (Signed)
Pt left a message on my vm that she would like her medical records sent from [facility] to Primary Care at [facility]. I need to know which [facility]. I left a vague message on her vm.

## 2019-08-12 NOTE — Progress Notes (Signed)
New Patient Office Visit  Subjective:  Patient ID: Carol Rogers, female    DOB: 28-Jun-1990  Age: 29 y.o. MRN: [id number]  CC:  Chief Complaint  Patient presents with  . Establish Care    HPI Carol Rogers presents for   Patient was getting her care at [facility]. She reports that she has a history of a right shoulder injury and saw Neurology for Nerve Pain Onset 02/2018 She is getting gabapentin and is seeing the Psych nurse who started Cymbalta that helps with nerve pain and also treats her depression.  She was diagnosed with depression since age 57 She has been on meds since age 2 She is currently doing counseling No suicide attempts but had hospitalization six years ago.  She reports that the right shoulder and depression are her two biggest concern. She states that her car broke down and she is sharing a car with a family of 3 She is helping her mother who is a 41 year old and her mother is disabled and had a double breast reduction so she is helping with the 32 year old sister.  She feels like a coparent.  Depression screen PHQ 2/9 08/12/2019  Decreased Interest 3  Down, Depressed, Hopeless 3  PHQ - 2 Score 6  Altered sleeping 3  Tired, decreased energy 3  Change in appetite 3  Feeling bad or failure about yourself  3  Trouble concentrating 3  Moving slowly or fidgety/restless 0  Suicidal thoughts 0  PHQ-9 Score 21  Difficult doing work/chores Very difficult   She is in school full time and works part time She is in school at [employer] and is studying drama She is considering a minor for musical theatre She would like pursue broadway.      Past Medical History:  Diagnosis Date  . Acid reflux   . Allergy   . Anxiety   . Depression   . Dysmenorrhea   . Exercise-induced asthma     Past Surgical History:  Procedure Laterality Date  . TONSILLECTOMY    . WISDOM TOOTH EXTRACTION      Family History  Problem Relation Age of Onset  .  Hyperlipidemia Mother   . Anxiety disorder Mother   . Depression Mother   . Depression Father   . Diabetes Maternal Grandmother   . Diabetes Paternal Grandfather     Social History   Socioeconomic History  . Marital status: Single    Spouse name: Not on file  . Number of children: Not on file  . Years of education: Not on file  . Highest education level: Not on file  Occupational History  . Not on file  Tobacco Use  . Smoking status: Never Smoker  . Smokeless tobacco: Never Used  Substance and Sexual Activity  . Alcohol use: Yes    Comment: had couple drinks in Sept 2014, birthday  . Drug use: No  . Sexual activity: Never    Birth control/protection: Pill, Abstinence  Other Topics Concern  . Not on file  Social History Narrative  . Not on file   Social Determinants of Health   Financial Resource Strain:   . Difficulty of Paying Living Expenses: Not on file  Food Insecurity:   . Worried About Programme researcher, broadcasting/film/video in the Last Year: Not on file  . Ran Out of Food in the Last Year: Not on file  Transportation Needs:   . Lack of Transportation (Medical): Not on file  .  Lack of Transportation (Non-Medical): Not on file  Physical Activity:   . Days of Exercise per Week: Not on file  . Minutes of Exercise per Session: Not on file  Stress:   . Feeling of Stress : Not on file  Social Connections:   . Frequency of Communication with Friends and Family: Not on file  . Frequency of Social Gatherings with Friends and Family: Not on file  . Attends Religious Services: Not on file  . Active Member of Clubs or Organizations: Not on file  . Attends Archivist Meetings: Not on file  . Marital Status: Not on file  Intimate Partner Violence:   . Fear of Current or Ex-Partner: Not on file  . Emotionally Abused: Not on file  . Physically Abused: Not on file  . Sexually Abused: Not on file    ROS Review of Systems Review of Systems  Constitutional: Negative for  activity change, appetite change, chills and fever.  HENT: Negative for congestion, nosebleeds, trouble swallowing and voice change.   Respiratory: Negative for cough, shortness of breath and wheezing.   Gastrointestinal: Negative for diarrhea, nausea and vomiting.  Genitourinary: Negative for difficulty urinating, dysuria, flank pain and hematuria.  Musculoskeletal: Negative for back pain, joint swelling and neck pain.  Neurological: Negative for dizziness, speech difficulty, light-headedness and numbness.  See HPI. All other review of systems negative.   Objective:   Today's Vitals: BP 127/79   Pulse 94   Temp 98.7 F (37.1 C) (Temporal)   Resp 16   Ht 5' 4.5" (1.638 m)   Wt 171 lb (77.6 kg)   LMP 07/21/2019 (Exact Date)   SpO2 99%   BMI 28.90 kg/m  Wt Readings from Last 3 Encounters:  08/12/19 171 lb (77.6 kg)  02/05/12 117 lb (53.1 kg)  06/19/11 117 lb (53.1 kg) (28 %, Z= -0.59)*   * Growth percentiles are based on CDC (Girls, 2-20 Years) data.    Physical Exam Physical Exam  Constitutional: Oriented to person, place, and time. Appears well-developed and well-nourished.  HENT:  Head: Normocephalic and atraumatic.  Eyes: Conjunctivae and EOM are normal.  Cardiovascular: Normal rate, regular rhythm, normal heart sounds and intact distal pulses.  No murmur heard. Pulmonary/Chest: Effort normal and breath sounds normal. No stridor. No respiratory distress. Has no wheezes.  Neurological: Is alert and oriented to person, place, and time.  Skin: Skin is warm. Capillary refill takes less than 2 seconds.  Psychiatric: Has a normal mood and affect. Behavior is normal. Judgment and thought content normal.   CRANIAL NERVES: CN II: Visual fields are full to confrontation. Fundoscopic exam is normal with sharp discs and no vascular changes. Pupils are round equal and briskly reactive to light. CN III, IV, VI: extraocular movement are normal. No ptosis. CN V: Facial sensation is  intact to pinprick in all 3 divisions bilaterally. Corneal responses are intact.  CN VII: Face is symmetric with normal eye closure and smile. CN VIII: Hearing is normal to rubbing fingers CN IX, X: Palate elevates symmetrically. Phonation is normal. CN XI: Head turning and shoulder shrug are intact CN XII: Tongue is midline with normal movements and no atrophy.  Assessment & Plan:   Problem List Items Addressed This Visit      Other   Major depression    Other Visit Diagnoses    PTSD (post-traumatic stress disorder)    -  Primary    Discussed referral to virtual [facility] Consent given to contact  patient for virtual health counseling Continued medication Advised pt to increase her anxiety dose of buspirone to take an additional dose as  Needed    Outpatient Encounter Medications as of 08/12/2019  Medication Sig  . buPROPion (WELLBUTRIN XL) 150 MG 24 hr tablet Take 1 tablet (150 mg total) by mouth daily.  . busPIRone (BUSPAR) 7.5 MG tablet Take 1 tablet (7.5 mg total) by mouth 2 (two) times daily. (Patient taking differently: Take by mouth 2 (two) times daily. )  . cetirizine (ZYRTEC) 10 MG tablet Take 10 mg by mouth daily.  . norethindrone-ethinyl estradiol (MICROGESTIN,JUNEL,LOESTRIN) 1-20 MG-MCG tablet Take 1 tablet by mouth daily.  Marland Kitchen albuterol (PROVENTIL HFA;VENTOLIN HFA) 108 (90 BASE) MCG/ACT inhaler Inhale 2 puffs into the lungs every 6 (six) hours as needed for wheezing or shortness of breath.  . hydrOXYzine (ATARAX/VISTARIL) 25 MG tablet Take 1 tablet (25 mg total) by mouth every 6 (six) hours as needed for anxiety (sleep). (Patient not taking: Reported on 08/12/2019)  . methocarbamol (ROBAXIN) 500 MG tablet Take 1 tablet (500 mg total) by mouth 2 (two) times daily. (Patient not taking: Reported on 08/12/2019)  . naproxen (NAPROSYN) 500 MG tablet Take 1 tablet (500 mg total) by mouth 2 (two) times daily. (Patient not taking: Reported on 08/12/2019)  . prazosin (MINIPRESS) 1 MG capsule Take  1 capsule (1 mg total) by mouth at bedtime. (Patient not taking: Reported on 08/12/2019)   No facility-administered encounter medications on file as of 08/12/2019.    Follow-up: Return in about 6 weeks (around 09/23/2019) for mood.   Doristine Bosworth, MD

## 2019-08-15 ENCOUNTER — Telehealth: Payer: Self-pay | Admitting: Clinical

## 2019-08-15 NOTE — Telephone Encounter (Signed)
Referral received from Dr. Creta Levin for [facility].  TC to [facility], no answer. This Behavioral Health Clinician left a message to call back with name & contact information.

## 2019-09-05 ENCOUNTER — Telehealth: Payer: Self-pay

## 2019-09-05 ENCOUNTER — Telehealth: Payer: Self-pay | Admitting: Family Medicine

## 2019-09-05 DIAGNOSIS — F329 Major depressive disorder, single episode, unspecified: Secondary | ICD-10-CM

## 2019-09-05 NOTE — Telephone Encounter (Signed)
Pt requesting referral to [facility] in [place], Dr. Starling Manns. Please advise Pt has discussed depression with pcp

## 2019-09-05 NOTE — Telephone Encounter (Signed)
Pt requesting referral for psychiatric has previously discussed depression with you.

## 2019-09-10 NOTE — Addendum Note (Signed)
Addended by: Collie Siad A on: 09/10/2019 03:04 PM   Modules accepted: Orders

## 2019-09-10 NOTE — Telephone Encounter (Signed)
Please notify patient of the referral being placed.  Let her know that I reordered virtual services as well.

## 2019-09-15 ENCOUNTER — Telehealth (INDEPENDENT_AMBULATORY_CARE_PROVIDER_SITE_OTHER): Payer: Self-pay | Admitting: Licensed Clinical Social Worker

## 2019-09-15 NOTE — Telephone Encounter (Signed)
Left message encouraging contact 

## 2019-09-24 ENCOUNTER — Ambulatory Visit: Payer: BC Managed Care – PPO | Admitting: Family Medicine

## 2019-09-25 ENCOUNTER — Encounter: Payer: Self-pay | Admitting: Family Medicine

## 2020-06-07 ENCOUNTER — Other Ambulatory Visit: Payer: Medicaid Other

## 2020-11-10 DIAGNOSIS — Z6841 Body Mass Index (BMI) 40.0 and over, adult: Secondary | ICD-10-CM | POA: Insufficient documentation

## 2020-11-30 DIAGNOSIS — I10 Essential (primary) hypertension: Secondary | ICD-10-CM | POA: Insufficient documentation

## 2021-02-07 ENCOUNTER — Emergency Department (HOSPITAL_BASED_OUTPATIENT_CLINIC_OR_DEPARTMENT_OTHER)
Admission: EM | Admit: 2021-02-07 | Discharge: 2021-02-07 | Disposition: A | Discharge status: Home / Self Care | Payer: BC Managed Care – PPO | Attending: Emergency Medicine | Admitting: Emergency Medicine

## 2021-02-07 ENCOUNTER — Encounter (HOSPITAL_BASED_OUTPATIENT_CLINIC_OR_DEPARTMENT_OTHER): Payer: Self-pay

## 2021-02-07 ENCOUNTER — Other Ambulatory Visit: Payer: Self-pay

## 2021-02-07 DIAGNOSIS — M545 Low back pain, unspecified: Secondary | ICD-10-CM | POA: Insufficient documentation

## 2021-02-07 DIAGNOSIS — M7918 Myalgia, other site: Secondary | ICD-10-CM

## 2021-02-07 DIAGNOSIS — Z91010 Allergy to peanuts: Secondary | ICD-10-CM | POA: Insufficient documentation

## 2021-02-07 DIAGNOSIS — M791 Myalgia, unspecified site: Secondary | ICD-10-CM | POA: Diagnosis not present

## 2021-02-07 DIAGNOSIS — M25561 Pain in right knee: Secondary | ICD-10-CM | POA: Diagnosis not present

## 2021-02-07 DIAGNOSIS — Y92410 Unspecified street and highway as the place of occurrence of the external cause: Secondary | ICD-10-CM | POA: Insufficient documentation

## 2021-02-07 DIAGNOSIS — Z91040 Latex allergy status: Secondary | ICD-10-CM | POA: Diagnosis not present

## 2021-02-07 DIAGNOSIS — M25562 Pain in left knee: Secondary | ICD-10-CM | POA: Insufficient documentation

## 2021-02-07 MED ORDER — KETOROLAC TROMETHAMINE 30 MG/ML IJ SOLN
30.0000 mg | Freq: Once | INTRAMUSCULAR | Status: AC
  Administered 2021-02-07: 30 mg via INTRAMUSCULAR
  Filled 2021-02-07: qty 1
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
## 2021-02-09 ENCOUNTER — Emergency Department (HOSPITAL_BASED_OUTPATIENT_CLINIC_OR_DEPARTMENT_OTHER)
Admission: EM | Admit: 2021-02-09 | Discharge: 2021-02-09 | Disposition: A | Discharge status: Home / Self Care | Payer: BC Managed Care – PPO | Attending: Emergency Medicine | Admitting: Emergency Medicine

## 2021-02-09 ENCOUNTER — Other Ambulatory Visit: Payer: Self-pay

## 2021-02-09 ENCOUNTER — Encounter (HOSPITAL_BASED_OUTPATIENT_CLINIC_OR_DEPARTMENT_OTHER): Payer: Self-pay

## 2021-02-09 DIAGNOSIS — M25512 Pain in left shoulder: Secondary | ICD-10-CM | POA: Diagnosis not present

## 2021-02-09 DIAGNOSIS — Z91040 Latex allergy status: Secondary | ICD-10-CM | POA: Insufficient documentation

## 2021-02-09 DIAGNOSIS — M25511 Pain in right shoulder: Secondary | ICD-10-CM | POA: Diagnosis not present

## 2021-02-09 DIAGNOSIS — Z91010 Allergy to peanuts: Secondary | ICD-10-CM | POA: Insufficient documentation

## 2021-02-09 DIAGNOSIS — Y92410 Unspecified street and highway as the place of occurrence of the external cause: Secondary | ICD-10-CM | POA: Diagnosis not present

## 2021-02-09 DIAGNOSIS — M545 Low back pain, unspecified: Secondary | ICD-10-CM | POA: Insufficient documentation

## 2021-02-09 DIAGNOSIS — M25561 Pain in right knee: Secondary | ICD-10-CM | POA: Diagnosis not present

## 2021-02-09 MED ORDER — METHOCARBAMOL 500 MG PO TABS: 500.0000 mg | ORAL_TABLET | Freq: Two times a day (BID) | ORAL | 0 refills | Status: DC

## 2021-02-09 MED ORDER — KETOROLAC TROMETHAMINE 15 MG/ML IJ SOLN
30.0000 mg | Freq: Once | INTRAMUSCULAR | Status: AC
  Administered 2021-02-09: 30 mg via INTRAMUSCULAR
  Filled 2021-02-09: qty 2

## 2021-02-09 MED ORDER — DICLOFENAC SODIUM 1 % EX GEL: 4.0000 g | Freq: Four times a day (QID) | CUTANEOUS | 0 refills | Status: DC
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
## 2021-02-09 NOTE — Discharge Instructions (Addendum)
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
## 2021-02-15 ENCOUNTER — Other Ambulatory Visit: Payer: Self-pay

## 2021-02-15 ENCOUNTER — Emergency Department (HOSPITAL_BASED_OUTPATIENT_CLINIC_OR_DEPARTMENT_OTHER)
Admission: EM | Admit: 2021-02-15 | Discharge: 2021-02-16 | Disposition: A | Discharge status: Home / Self Care | Payer: BC Managed Care – PPO | Attending: Emergency Medicine | Admitting: Emergency Medicine

## 2021-02-15 ENCOUNTER — Encounter (HOSPITAL_BASED_OUTPATIENT_CLINIC_OR_DEPARTMENT_OTHER): Payer: Self-pay | Admitting: *Deleted

## 2021-02-15 DIAGNOSIS — Z0000 Encounter for general adult medical examination without abnormal findings: Secondary | ICD-10-CM | POA: Diagnosis not present

## 2021-02-15 DIAGNOSIS — Z91040 Latex allergy status: Secondary | ICD-10-CM | POA: Diagnosis not present

## 2021-02-15 DIAGNOSIS — Z0441 Encounter for examination and observation following alleged adult rape: Secondary | ICD-10-CM | POA: Diagnosis not present

## 2021-02-15 DIAGNOSIS — T7421XA Adult sexual abuse, confirmed, initial encounter: Secondary | ICD-10-CM | POA: Diagnosis not present

## 2021-02-15 DIAGNOSIS — Z008 Encounter for other general examination: Secondary | ICD-10-CM

## 2021-02-15 DIAGNOSIS — T7421XD Adult sexual abuse, confirmed, subsequent encounter: Secondary | ICD-10-CM | POA: Diagnosis not present
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
## 2021-02-16 ENCOUNTER — Emergency Department (HOSPITAL_BASED_OUTPATIENT_CLINIC_OR_DEPARTMENT_OTHER)
Admission: EM | Admit: 2021-02-16 | Discharge: 2021-02-16 | Disposition: A | Discharge status: Home / Self Care | Payer: BC Managed Care – PPO | Attending: Emergency Medicine | Admitting: Emergency Medicine

## 2021-02-16 ENCOUNTER — Encounter (HOSPITAL_BASED_OUTPATIENT_CLINIC_OR_DEPARTMENT_OTHER): Payer: Self-pay

## 2021-02-16 ENCOUNTER — Ambulatory Visit (HOSPITAL_COMMUNITY)
Admission: EM | Admit: 2021-02-16 | Discharge: 2021-02-16 | Disposition: A | Discharge status: Home / Self Care | Payer: No Typology Code available for payment source | Source: Ambulatory Visit | Attending: Emergency Medicine | Admitting: Emergency Medicine

## 2021-02-16 ENCOUNTER — Other Ambulatory Visit: Payer: Self-pay

## 2021-02-16 DIAGNOSIS — J45909 Unspecified asthma, uncomplicated: Secondary | ICD-10-CM | POA: Diagnosis not present

## 2021-02-16 DIAGNOSIS — Z91040 Latex allergy status: Secondary | ICD-10-CM | POA: Insufficient documentation

## 2021-02-16 DIAGNOSIS — T7421XD Adult sexual abuse, confirmed, subsequent encounter: Secondary | ICD-10-CM

## 2021-02-16 DIAGNOSIS — Z0441 Encounter for examination and observation following alleged adult rape: Secondary | ICD-10-CM | POA: Diagnosis present

## 2021-02-16 DIAGNOSIS — T7421XA Adult sexual abuse, confirmed, initial encounter: Secondary | ICD-10-CM | POA: Diagnosis present

## 2021-02-16 DIAGNOSIS — Z91010 Allergy to peanuts: Secondary | ICD-10-CM | POA: Insufficient documentation

## 2021-02-16 LAB — POC URINE PREG, ED: Preg Test, Ur: NEGATIVE

## 2021-02-16 MED ORDER — AZITHROMYCIN 250 MG PO TABS
1000.0000 mg | ORAL_TABLET | Freq: Once | ORAL | Status: AC
  Administered 2021-02-16: 1000 mg via ORAL

## 2021-02-16 MED ORDER — HYDROXYZINE HCL 25 MG PO TABS
25.0000 mg | ORAL_TABLET | Freq: Once | ORAL | Status: AC
  Administered 2021-02-16: 25 mg via ORAL
  Filled 2021-02-16: qty 1

## 2021-02-16 MED ORDER — PROMETHAZINE HCL 25 MG PO TABS
25.0000 mg | ORAL_TABLET | Freq: Four times a day (QID) | ORAL | Status: DC | PRN
  Administered 2021-02-16: 75 mg via ORAL

## 2021-02-16 MED ORDER — METRONIDAZOLE 500 MG PO TABS
2000.0000 mg | ORAL_TABLET | Freq: Once | ORAL | Status: AC
  Administered 2021-02-16: 2000 mg via ORAL

## 2021-02-16 MED ORDER — LIDOCAINE HCL (PF) 1 % IJ SOLN
2.1000 mL | Freq: Once | INTRAMUSCULAR | Status: AC
  Administered 2021-02-16: 2 mL

## 2021-02-16 MED ORDER — CEFTRIAXONE SODIUM 1 G IJ SOLR
1.0000 g | Freq: Once | INTRAMUSCULAR | Status: AC
  Administered 2021-02-16: 1 g via INTRAMUSCULAR
  Filled 2021-02-16: qty 10
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
## 2021-02-16 NOTE — Discharge Instructions (Addendum)
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 before surgery, and to help treat pain or nausea after surgery.   HOW TO USE:  Your doctor or healthcare provider will tell you how much of this medicine to use and how often.  Take this medicine by mouth with a glass of water or with food or milk.  Take your  doses at regular intervals.  Do not take this medication more often than directed. SIDE EFFECTS:  You should report the following side effects to your doctor or healthcare provider as soon as possible:  blurred vision, irregular heartbeat, palpitations, or chest pain or tightness, muscle or facial twitches, pain or difficulty passing urine, dark-colored urine, pale stools, seizures, skin rash (including itching or hives), slowed or shallow breathing, trouble breathing, unusual bleeding or bruising, yellowing of the eyes or skin, swelling in your face or hands, swelling  or tingling in your mouth or throat, fever, sweating, confusion, pain in your upper stomach, problems with balance, walking, or speech, seeing or hearing things that are not really there (especially in children). Side effects that usually do not require medical attention but you should report to your doctor or healthcare provider if they continue or are bothersome include:  headache, nightmares, agitation, nervousness, excitability, not being able to sleep (more likely in children), stuffy  or runny nose, dry mouth, mild skin rash or itching, ringing in your ears.  Tell your doctor or healthcare provider if your symptoms do not start to get better in 1-2 days.  You may get drowsy or dizzy.  Do not drive, use machinery, or do anything that needs mental alertness until you know how this medication will affect you.  To reduce the risk of dizzy or fainting spells, do not stand or sit up too quickly, especially if you are an older patient.  Alcohol may increase dizziness and drowsiness. Your mouth can get dry.  Chewing sugarless gum or sucking on hard candy and drinking plenty of water may help.  Contact your doctor if the problem does not go away or is severe.  Since this medication can cause dry eyes and blurred vision, if you wear contact lenses you may feel some discomfort.  Lubricating drops may help.  See your eye doctor if the problem does not go away or is severe.  This medication can also make you sensitive to the sun.  Keep out of the sun.  If you cannot avoid being in the sun, wear protective clothing and use sunscreen.  Do not use sunlamps or tanning beds/booths.  If you are diabetic, check your blood sugar levels regularly. This list may not describe all possible side effects.  If you notice other effects not listed above, contact your doctor.  You may report side effects to the Food & Drug Administration (FDA) at 1-800-FDA-1088. PRECAUTIONS:  Your doctor or healthcare provider needs to know if you have  any of the following conditions:  glaucoma, high blood pressure or heart disease, kidney disease, liver disease, lung disease or breathing problems (like asthma), prostate trouble, pain or difficulty passing urine, seizures, an unusual or allergic reaction to promethazine or phenothiazine medicines (e.g. perphenazine, thioridazine, Compazine, Thorazine, and Trilafon), other medicines, foods, dyes, or preservatives, if you are pregnant or trying to get pregnant, or breast-feeding.  Talk to your pediatrician regarding the use of this medicine in children.  Special care may be needed.  This medicine should not be given to infants and children younger than 36 years old.   Make sure your doctor or healthcare professional knows if you are pregnant or breast-feeding.  Tell your doctor if you have Chronic Obstructive Pulmonary Disease (COPD), asthma, sleep apnea, if you have or have ever had Neuroleptic Malignant Syndrome (NMS),  DRUG INTERACTIONS:  Do not take this medication with any of the following medications:  medicines called MAO Inhibitors (e.g.  Nardil, Parnate, Marplan, Eldepryl), or other phenothiazines like trimethobenzamide.  This medication may also interact with the following medications:  barbiturates like phenobarbital, bromocriptine, certain antidepressants, certain antihistamines used in allergy or cold medicines, epinephrine, levodopa, medications for sleep, medications for mental problems & psychotic disturbances (e.g. amitriptyline, doxepin, nortriptyline, phenylzine, selegiline, Elavil, Pamelor, Sinequan), medications for movement abnormalities such as Parkinson's Disease, medications for gastrointestinal problems, muscle relaxants, narcotic pain medications, sedatives.  Do not drink alcohol while using this medicine. This document does not contain all possible interactions.  Therefore, before using this product, tell your doctor or healthcare provider of all the products you use.  Keep a list of all  your medications with you, and share the list with your doctor or healthcare provider. NOTES:  Do not share this medication with others.  If you think you have taken too much of this medicine, contact a poison control center or emergency room at once. MISSED DOSE:  If you miss a dose, take it as soon as you can.  If it is almost time for your next dose, take only that dose.  Do not take double or extra doses. STORAGE:  Store at room temperature between 68-77 degrees F (20-25 degrees C), away from light and moisture.  Do not store in the bathroom.  Keep all medicines away from children and pets.  Do not flush medications down the toilet or pour them into the drain unless instructed to do so.  Properly discard this product when it is expired or no longer needed.  Consult your pharmacist or local waste disposal company for more details about how to safely discard this product.

## 2021-02-16 NOTE — SANE Note (Signed)
On 02/16/2021, at approximately 0245 hours, the SANE/FNE Teacher, music) consult was completed. The primary RN has been notified. Please contact the SANE/FNE nurse on call (listed in Amion) with any further concerns.
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
## 2021-02-16 NOTE — ED Notes (Signed)
SANE RN Mardella Layman at bedside.

## 2021-02-16 NOTE — SANE Note (Signed)
Follow-up Phone Call  Patient gives verbal consent for a FNE/SANE follow-up phone call in 48-72 hours: DID NOT ASK THE PT. Patient's telephone number: [phone] (PT'S CELL W/ VM & TEXTING) Patient gives verbal consent to leave voicemail at the phone number listed above: DID NOT ASK THE PT. DO NOT CALL between the hours of: N/A   PT'S EMAIL:  [email or web address]  [place] [employer] CASE NUMBER:  [id number] OFFICER:  [employer]   563m was provided in the SANE Kit #1, and was mixed with the 2.039mof lidocaine.  However, needed 1g of Rocephin, so 50071m/ lidocaine was discarded in trash, in front on MelKenneyD RN, and she retrieved the 1g of Rocephin from the Pyxis.  Rocephin 1g was mixed with 2mL43m sterile water and administered to the pt.  URINE POC METER DID NOT TRANSFER THE NEGATIVE RESULTS TO THE CHART IN Epic.  TOOK THE METER OUT TO THE ED RN STATION TO UPLOAD THE RESULTS.  RESULTS STILL DID NOT TRANSFER.  RESULTS WERE FAXED OVER TO THE POC.

## 2021-02-16 NOTE — SANE Note (Signed)
Urine POC performed in SANE Room.  Results were NEGATIVE and uploaded to the meter.  However, the results are not showing under patient results as of 0250 hours.

## 2021-02-16 NOTE — SANE Note (Signed)
SANE/FNE has taken the pt to the SANE Room.    Please do not discharge the pt in Epic, as she will be in the SANE Room for several hours.  The SANE/FNE will discharge the pt when the examination is over.  The pt's ED room can be cleaned, as pt will not be returning back to that room.
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
## 2021-02-16 NOTE — ED Notes (Signed)
Pt ambulatory at d/c with independent steady gait, NAD. D/C instructions and follow up care reviewed by SANE RN.
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
## 2021-02-17 NOTE — SANE Note (Signed)
On 02/16/2021, at approximately 2339 hours, the SANE/FNE Teacher, music) consult was completed.  Please contact the SANE/FNE nurse on call (listed in Amion) with any further concerns.

## 2021-02-18 LAB — POC URINE PREG, ED: Preg Test, Ur: NEGATIVE

## 2021-03-23 ENCOUNTER — Other Ambulatory Visit: Payer: Self-pay

## 2021-03-23 ENCOUNTER — Encounter: Payer: Self-pay | Admitting: Certified Nurse Midwife

## 2021-03-23 ENCOUNTER — Ambulatory Visit (INDEPENDENT_AMBULATORY_CARE_PROVIDER_SITE_OTHER): Payer: BC Managed Care – PPO | Admitting: Certified Nurse Midwife

## 2021-03-23 ENCOUNTER — Other Ambulatory Visit (HOSPITAL_COMMUNITY)
Admission: RE | Admit: 2021-03-23 | Discharge: 2021-03-23 | Disposition: A | Discharge status: Home / Self Care | Payer: BC Managed Care – PPO | Source: Ambulatory Visit | Attending: Certified Nurse Midwife | Admitting: Certified Nurse Midwife

## 2021-03-23 VITALS — BP 125/79 | HR 103 | Temp 97.8°F | Ht 64.0 in | Wt 244.0 lb

## 2021-03-23 DIAGNOSIS — Z01419 Encounter for gynecological examination (general) (routine) without abnormal findings: Secondary | ICD-10-CM | POA: Insufficient documentation
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
## 2021-03-29 LAB — CYTOLOGY - PAP
Comment: NEGATIVE
Diagnosis: UNDETERMINED — AB
High risk HPV: POSITIVE — AB

## 2021-04-04 ENCOUNTER — Telehealth: Payer: Self-pay | Admitting: Certified Nurse Midwife

## 2021-04-04 NOTE — Telephone Encounter (Signed)
Attempted to reach patient about her appointment at the [facility]. Left a detailed message for her to call the office for any questions pertaining to her appointment.

## 2021-05-23 ENCOUNTER — Other Ambulatory Visit: Payer: Self-pay

## 2021-05-23 ENCOUNTER — Ambulatory Visit (INDEPENDENT_AMBULATORY_CARE_PROVIDER_SITE_OTHER): Payer: BC Managed Care – PPO | Admitting: Obstetrics and Gynecology

## 2021-05-23 ENCOUNTER — Encounter: Payer: Self-pay | Admitting: Obstetrics and Gynecology

## 2021-05-23 ENCOUNTER — Other Ambulatory Visit (HOSPITAL_COMMUNITY)
Admission: RE | Admit: 2021-05-23 | Discharge: 2021-05-23 | Disposition: A | Discharge status: Home / Self Care | Payer: BC Managed Care – PPO | Source: Ambulatory Visit | Attending: Obstetrics and Gynecology | Admitting: Obstetrics and Gynecology

## 2021-05-23 VITALS — BP 121/75 | HR 87 | Wt 226.0 lb

## 2021-05-23 DIAGNOSIS — R87619 Unspecified abnormal cytological findings in specimens from cervix uteri: Secondary | ICD-10-CM | POA: Diagnosis present

## 2021-05-23 DIAGNOSIS — Z3202 Encounter for pregnancy test, result negative: Secondary | ICD-10-CM

## 2021-05-23 DIAGNOSIS — F329 Major depressive disorder, single episode, unspecified: Secondary | ICD-10-CM

## 2021-05-23 LAB — POCT PREGNANCY, URINE: Preg Test, Ur: NEGATIVE

## 2021-05-23 NOTE — Progress Notes (Signed)
    GYNECOLOGY OFFICE COLPOSCOPY PROCEDURE NOTE  30 y.o. G0P0000 here for colposcopy for ASCUS with POSITIVE high risk HPV pap smear on 03/2021. She denies any prior abnormal paps.   Pregnancy test:  negative Gardasil:   Has not yet had but losing insurance shortly . Discussed role for HPV in cervical dysplasia, need for surveillance.  Informed consent and review of risks, benefit and alternatives performed. Written consent given.   Speculum inserted into patient's vagina assuring full view of cervix and vaginal walls. 3 swabs of vinegar solution applied to the cervix and vaginal walls and colposcope was used to observe both the cervix and vaginal walls.   Colposcopy adequate? No - could not see full squamocolumnar junction.   no visible lesions, no mosaicism, no punctation, and no abnormal vasculature.  ECC collected.  All specimens were labeled and sent to pathology.  Monsel's applied to biopsy sites for good hemostasis and speculum removed.  Pt tolerated well with minimal pain and bleeding.   Patient was given post procedure instructions.  Will follow up pathology and manage accordingly; patient will be contacted with results and recommendations.  Routine preventative health maintenance measures emphasized.    Milas Hock, MD, FACOG Obstetrician & Gynecologist, [facility] for [facility], [facility]

## 2021-05-25 LAB — SURGICAL PATHOLOGY

## 2021-06-01 ENCOUNTER — Telehealth: Payer: Self-pay | Admitting: Licensed Clinical Social Worker

## 2021-06-01 NOTE — Telephone Encounter (Signed)
Called patient regarding [facility] referral to schedule appt. Left message requesting callback.

## 2021-06-08 ENCOUNTER — Telehealth: Payer: Self-pay | Admitting: Licensed Clinical Social Worker

## 2021-06-08 NOTE — Telephone Encounter (Signed)
Called pt regarding referral for ibh services. Unable to leave message due to mailbox is full.

## 2021-10-17 NOTE — Telephone Encounter (Signed)
NA

## 2022-04-06 ENCOUNTER — Telehealth: Payer: Self-pay | Admitting: Certified Nurse Midwife

## 2022-04-06 DIAGNOSIS — Z5941 Food insecurity: Secondary | ICD-10-CM

## 2022-04-06 DIAGNOSIS — I10 Essential (primary) hypertension: Secondary | ICD-10-CM

## 2022-04-06 MED ORDER — AMLODIPINE BESYLATE 10 MG PO TABS: 10.0000 mg | ORAL_TABLET | Freq: Every day | ORAL | 6 refills | Status: AC

## 2022-04-06 NOTE — Telephone Encounter (Signed)
Pt called to inquire about BP meds, has been on norvasc 10mg  for over a year with well-controlled blood pressures. Insurance has lapsed but her PCP is requiring a visit for refills (which is likely to cost over $400). Reports BP on amlodipine is always >130/80, without will go up to 150/90. Expects to qualify for the Medicaid expansion in December, will be able to get in for a preventative visit at that time.  Also continues to experience some food insecurity, referral placed to [employer] and pt aware she can go whenever needed.  Gaylan Gerold, CNM, MSN, [place] Certified Nurse Midwife, [facility]

## 2022-04-25 ENCOUNTER — Emergency Department (HOSPITAL_COMMUNITY)
Admission: EM | Admit: 2022-04-25 | Discharge: 2022-04-26 | Discharge status: Against Medical Advice | Payer: No Typology Code available for payment source | Attending: Student | Admitting: Student

## 2022-04-25 ENCOUNTER — Encounter (HOSPITAL_COMMUNITY): Payer: Self-pay | Admitting: Emergency Medicine

## 2022-04-25 ENCOUNTER — Emergency Department (HOSPITAL_COMMUNITY): Payer: No Typology Code available for payment source

## 2022-04-25 ENCOUNTER — Other Ambulatory Visit: Payer: Self-pay

## 2022-04-25 DIAGNOSIS — M25551 Pain in right hip: Secondary | ICD-10-CM | POA: Diagnosis not present

## 2022-04-25 DIAGNOSIS — Y92481 Parking lot as the place of occurrence of the external cause: Secondary | ICD-10-CM | POA: Insufficient documentation

## 2022-04-25 DIAGNOSIS — Z5321 Procedure and treatment not carried out due to patient leaving prior to being seen by health care provider: Secondary | ICD-10-CM | POA: Diagnosis not present

## 2022-04-25 DIAGNOSIS — M25511 Pain in right shoulder: Secondary | ICD-10-CM | POA: Insufficient documentation

## 2022-04-25 LAB — POC URINE PREG, ED: Preg Test, Ur: NEGATIVE

## 2022-04-25 LAB — PREGNANCY, URINE: Preg Test, Ur: NEGATIVE
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
## 2022-05-10 ENCOUNTER — Other Ambulatory Visit: Payer: Self-pay | Admitting: Certified Nurse Midwife

## 2022-05-10 DIAGNOSIS — J0110 Acute frontal sinusitis, unspecified: Secondary | ICD-10-CM

## 2022-05-10 DIAGNOSIS — N760 Acute vaginitis: Secondary | ICD-10-CM

## 2022-05-10 MED ORDER — FLUTICASONE PROPIONATE 50 MCG/ACT NA SUSP: 1.0000 | Freq: Every day | NASAL | 2 refills | Status: AC

## 2022-05-10 MED ORDER — FLUCONAZOLE 150 MG PO TABS: 150.0000 mg | ORAL_TABLET | Freq: Every day | ORAL | 1 refills | Status: DC

## 2022-05-10 MED ORDER — AMOXICILLIN-POT CLAVULANATE 875-125 MG PO TABS: 1.0000 | ORAL_TABLET | Freq: Two times a day (BID) | ORAL | 0 refills | Status: AC

## 2022-06-15 ENCOUNTER — Emergency Department (HOSPITAL_BASED_OUTPATIENT_CLINIC_OR_DEPARTMENT_OTHER)
Admission: EM | Admit: 2022-06-15 | Discharge: 2022-06-15 | Disposition: A | Discharge status: Home / Self Care | Payer: No Typology Code available for payment source | Attending: Emergency Medicine | Admitting: Emergency Medicine

## 2022-06-15 ENCOUNTER — Encounter (HOSPITAL_BASED_OUTPATIENT_CLINIC_OR_DEPARTMENT_OTHER): Payer: Self-pay | Admitting: Urology

## 2022-06-15 ENCOUNTER — Other Ambulatory Visit (HOSPITAL_BASED_OUTPATIENT_CLINIC_OR_DEPARTMENT_OTHER): Payer: Self-pay

## 2022-06-15 ENCOUNTER — Other Ambulatory Visit: Payer: Self-pay

## 2022-06-15 DIAGNOSIS — Z91040 Latex allergy status: Secondary | ICD-10-CM | POA: Insufficient documentation

## 2022-06-15 DIAGNOSIS — Z91010 Allergy to peanuts: Secondary | ICD-10-CM | POA: Insufficient documentation

## 2022-06-15 DIAGNOSIS — Y92410 Unspecified street and highway as the place of occurrence of the external cause: Secondary | ICD-10-CM | POA: Diagnosis not present

## 2022-06-15 DIAGNOSIS — S3992XA Unspecified injury of lower back, initial encounter: Secondary | ICD-10-CM | POA: Diagnosis present

## 2022-06-15 DIAGNOSIS — S39012A Strain of muscle, fascia and tendon of lower back, initial encounter: Secondary | ICD-10-CM | POA: Diagnosis not present

## 2022-06-15 LAB — URINALYSIS, ROUTINE W REFLEX MICROSCOPIC
Bilirubin Urine: NEGATIVE
Glucose, UA: NEGATIVE mg/dL
Ketones, ur: NEGATIVE mg/dL
Leukocytes,Ua: NEGATIVE
Nitrite: NEGATIVE
Protein, ur: NEGATIVE mg/dL
Specific Gravity, Urine: 1.015 (ref 1.005–1.030)
pH: 6.5 (ref 5.0–8.0)

## 2022-06-15 LAB — URINALYSIS, MICROSCOPIC (REFLEX)

## 2022-06-15 LAB — PREGNANCY, URINE: Preg Test, Ur: NEGATIVE

## 2022-06-15 MED ORDER — CYCLOBENZAPRINE HCL 10 MG PO TABS
10.0000 mg | ORAL_TABLET | Freq: Two times a day (BID) | ORAL | 0 refills | Status: DC | PRN
  Filled 2022-06-15: qty 20, 10d supply, fill #0

## 2022-06-15 MED ORDER — KETOROLAC TROMETHAMINE 60 MG/2ML IM SOLN
60.0000 mg | Freq: Once | INTRAMUSCULAR | Status: AC
  Administered 2022-06-15: 60 mg via INTRAMUSCULAR
  Filled 2022-06-15: qty 2

## 2022-06-15 MED ORDER — METHYLPREDNISOLONE 4 MG PO TBPK
ORAL_TABLET | ORAL | 0 refills | Status: DC
  Filled 2022-06-15: qty 21, 6d supply, fill #0
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
## 2022-06-15 NOTE — ED Notes (Signed)
Pt unable to urinate at this time, urine spec cup given   

## 2022-06-15 NOTE — Discharge Instructions (Signed)
Recommend 1000 mg of Tylenol every 6 hours as needed for pain as well.
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
## 2022-07-26 ENCOUNTER — Telehealth: Payer: Self-pay

## 2022-07-26 NOTE — Telephone Encounter (Signed)
Left message for pt to call office back regarding new pt appt.

## 2022-07-28 ENCOUNTER — Encounter: Payer: Medicaid Other | Admitting: Family Medicine

## 2022-08-19 ENCOUNTER — Encounter (HOSPITAL_COMMUNITY): Payer: Self-pay

## 2022-08-19 ENCOUNTER — Emergency Department (HOSPITAL_COMMUNITY): Payer: Medicaid Other

## 2022-08-19 ENCOUNTER — Other Ambulatory Visit: Payer: Self-pay

## 2022-08-19 ENCOUNTER — Emergency Department (HOSPITAL_COMMUNITY)
Admission: EM | Admit: 2022-08-19 | Discharge: 2022-08-20 | Disposition: A | Discharge status: Home / Self Care | Payer: Medicaid Other | Attending: Emergency Medicine | Admitting: Emergency Medicine

## 2022-08-19 DIAGNOSIS — I10 Essential (primary) hypertension: Secondary | ICD-10-CM | POA: Insufficient documentation

## 2022-08-19 DIAGNOSIS — J45909 Unspecified asthma, uncomplicated: Secondary | ICD-10-CM | POA: Insufficient documentation

## 2022-08-19 DIAGNOSIS — R519 Headache, unspecified: Secondary | ICD-10-CM | POA: Diagnosis present

## 2022-08-19 DIAGNOSIS — D75839 Thrombocytosis, unspecified: Secondary | ICD-10-CM | POA: Insufficient documentation

## 2022-08-19 DIAGNOSIS — R7309 Other abnormal glucose: Secondary | ICD-10-CM | POA: Insufficient documentation

## 2022-08-19 DIAGNOSIS — R002 Palpitations: Secondary | ICD-10-CM | POA: Insufficient documentation

## 2022-08-19 DIAGNOSIS — Z79899 Other long term (current) drug therapy: Secondary | ICD-10-CM | POA: Insufficient documentation

## 2022-08-19 DIAGNOSIS — Z91040 Latex allergy status: Secondary | ICD-10-CM | POA: Insufficient documentation

## 2022-08-19 DIAGNOSIS — Z91010 Allergy to peanuts: Secondary | ICD-10-CM | POA: Insufficient documentation

## 2022-08-19 LAB — I-STAT BETA HCG BLOOD, ED (MC, WL, AP ONLY): I-stat hCG, quantitative: <5 m[IU]/mL (ref <5)

## 2022-08-19 NOTE — ED Provider Triage Note (Signed)
Emergency Medicine Provider Triage Evaluation Note  Carol Rogers , a 32 y.o. female  was evaluated in triage.  Pt complains of high blood pressure, headache, shortness of breath.  Patient reports has been taking 10 mg amlodipine every day for the last 2 years which has controlled her blood pressure.  Patient states that beginning 2 days ago she developed headache that has been rated 8 out of 10 and refractory to ibuprofen or Tylenol.  Patient states that she is also developed floaters in her visual field.  Patient denies any chest pain however does endorse shortness of breath.  Review of Systems  Positive:  Negative:   Physical Exam  BP (!) 184/78 (BP Location: Right Arm)   Pulse (!) 107   Temp 98.4 F (36.9 C) (Oral)   Resp 18   Ht '5\' 4"'$  (1.626 m)   Wt 102.1 kg   LMP 06/05/2022 Comment: pt on continuous birth control  SpO2 98%   BMI 38.62 kg/m  Gen:   Awake, no distress   Resp:  Normal effort  MSK:   Moves extremities without difficulty  Other:    Medical Decision Making  Medically screening exam initiated at 10:20 PM.  Appropriate orders placed.  Laureen Konarski was informed that the remainder of the evaluation will be completed by another provider, this initial triage assessment does not replace that evaluation, and the importance of remaining in the ED until their evaluation is complete.     Azucena Cecil, PA-C 08/19/22 2221

## 2022-08-19 NOTE — ED Triage Notes (Signed)
Pt c/o HA, heart palpitations, and feeling clammy today. Pt reports associated ShOB. Pt reports he B/P has been elevated for a week and pt seeing floaters in her vision. Denies CP.

## 2022-08-20 LAB — CBC
HCT: 43.3 % (ref 36.0–46.0)
Hemoglobin: 14.4 g/dL (ref 12.0–15.0)
MCH: 29.1 pg (ref 26.0–34.0)
MCHC: 33.3 g/dL (ref 30.0–36.0)
MCV: 87.7 fL (ref 80.0–100.0)
Platelets: 477 10*3/uL — ABNORMAL HIGH (ref 150–400)
RBC: 4.94 MIL/uL (ref 3.87–5.11)
RDW: 12.5 % (ref 11.5–15.5)
WBC: 9.8 10*3/uL (ref 4.0–10.5)
nRBC: 0 % (ref 0.0–0.2)

## 2022-08-20 LAB — BASIC METABOLIC PANEL
Anion gap: 9 (ref 5–15)
BUN: 9 mg/dL (ref 6–20)
CO2: 20 mmol/L — ABNORMAL LOW (ref 22–32)
Calcium: 9.2 mg/dL (ref 8.9–10.3)
Chloride: 110 mmol/L (ref 98–111)
Creatinine, Ser: 0.71 mg/dL (ref 0.44–1.00)
GFR, Estimated: >60 mL/min (ref >60)
Glucose, Bld: 116 mg/dL — ABNORMAL HIGH (ref 70–99)
Potassium: 3.6 mmol/L (ref 3.5–5.1)
Sodium: 139 mmol/L (ref 135–145)

## 2022-08-20 LAB — TROPONIN I (HIGH SENSITIVITY): Troponin I (High Sensitivity): <2 ng/L (ref <18)

## 2022-08-20 MED ORDER — SODIUM CHLORIDE 0.9 % IV BOLUS
1000.0000 mL | Freq: Once | INTRAVENOUS | Status: AC
  Administered 2022-08-20: 1000 mL via INTRAVENOUS

## 2022-08-20 MED ORDER — DIPHENHYDRAMINE HCL 50 MG/ML IJ SOLN
25.0000 mg | Freq: Once | INTRAMUSCULAR | Status: AC
  Administered 2022-08-20: 25 mg via INTRAVENOUS
  Filled 2022-08-20: qty 1

## 2022-08-20 MED ORDER — PROCHLORPERAZINE EDISYLATE 10 MG/2ML IJ SOLN
10.0000 mg | Freq: Once | INTRAMUSCULAR | Status: AC
  Administered 2022-08-20: 10 mg via INTRAVENOUS
  Filled 2022-08-20: qty 2

## 2022-08-20 NOTE — Discharge Instructions (Signed)
Please check your blood pressure once a day and keep a record of it.  Take that record with you whenever you see your primary care provider.

## 2022-08-20 NOTE — ED Provider Notes (Signed)
[facility] Provider Note   CSN: [id number] Arrival date & time: 08/19/22  2141     History  Chief Complaint  Patient presents with   Palpitations    Carol Rogers is a 32 y.o. female.  The history is provided by the patient.  Palpitations She has history of hypertension, depression, asthma, GERD and comes in because her blood pressure has been elevated over the last 2 weeks.  Today, it was as high as 0000000 systolic.  Also, she is complaining of a global headache which started when she woke up this morning.  There is associated photophobia and some nausea but no vomiting.  She denies any weakness or numbness.  She has had floaters over the last several weeks, but there has been no vision change with the headache.  She did take a dose of ibuprofen and pseudoephedrine without any relief.   Home Medications Prior to Admission medications   Medication Sig Start Date End Date Taking? Authorizing Provider  albuterol (PROVENTIL HFA;VENTOLIN HFA) 108 (90 BASE) MCG/ACT inhaler Inhale 2 puffs into the lungs every 6 (six) hours as needed for wheezing or shortness of breath. Patient not taking: Reported on 05/23/2021    [provider]  amLODipine (NORVASC) 10 MG tablet Take 1 tablet (10 mg total) by mouth daily. 04/06/22   Gabriel Carina, CNM  cetirizine (ZYRTEC) 10 MG tablet Take 10 mg by mouth daily.    [provider]  cyclobenzaprine (FLEXERIL) 10 MG tablet Take 1 tablet (10 mg total) by mouth 2 (two) times daily as needed for muscle spasms. 06/15/22   Curatolo, Adam, DO  fluconazole (DIFLUCAN) 150 MG tablet Take 1 tablet (150 mg total) by mouth daily. 05/10/22   Gabriel Carina, CNM  fluticasone (FLONASE) 50 MCG/ACT nasal spray Place 1-2 sprays into both nostrils daily. 05/10/22   Gabriel Carina, CNM  gabapentin (NEURONTIN) 300 MG capsule Take by mouth. 07/25/19   [provider]  methylPREDNISolone (MEDROL DOSEPAK)  4 MG TBPK tablet Take 6 tablets (60 mg total) by mouth on day 1, then 5 tablets (50 mg total) on day 2, then 4 tablets (40 mg total) on day 3, then 3 tablets (30 mg total) on day 4, then 2 tablets (20 mg total) on day 5, and then 1 tablet (10 mg total) on day 6. 06/15/22   Curatolo, Adam, DO  Norethindrone-Ethinyl Estradiol-Fe Biphas (LO LOESTRIN FE) 1 MG-10 MCG / 10 MCG tablet Take 1 tablet by mouth daily. 06/26/19   [provider]  QUEtiapine (SEROQUEL) 100 MG tablet Take 100 mg by mouth at bedtime. 05/11/21   [provider]      Allergies    Banana, Latex, Peanut oil, Pineapple, Tomato, and Wheat    Review of Systems   Review of Systems  Cardiovascular:  Positive for palpitations.  All other systems reviewed and are negative.   Physical Exam Updated Vital Signs BP (!) 142/79   Pulse 69   Temp 99.2 F (37.3 C) (Oral)   Resp 14   Ht '5\' 4"'$  (1.626 m)   Wt 102.1 kg   LMP 06/21/2022 (Approximate) Comment: pt on continuous birth control  SpO2 99%   BMI 38.62 kg/m  Physical Exam Vitals and nursing note reviewed.   32 year old female, resting comfortably and in no acute distress. Vital signs are significant for elevated blood pressure. Oxygen saturation is 99%, which is normal. Head is normocephalic and atraumatic. PERRLA,  EOMI. Oropharynx is clear.  There is tenderness palpation over the temporalis muscles bilaterally but not over the insertion of the paracervical muscles. Neck is nontender and supple without adenopathy or JVD. Back is nontender and there is no CVA tenderness. Lungs are clear without rales, wheezes, or rhonchi. Chest is nontender. Heart has regular rate and rhythm without murmur. Abdomen is soft, flat, nontender. Extremities have no cyanosis or edema, full range of motion is present. Skin is warm and dry without rash. Neurologic: Mental status is normal, cranial nerves are intact, strength is 5/5 in all 4 extremities.  ED Results / Procedures /  Treatments   Labs (all labs ordered are listed, but only abnormal results are displayed) Labs Reviewed  BASIC METABOLIC PANEL - Abnormal; Notable for the following components:      Result Value   CO2 20 (*)    Glucose, Bld 116 (*)    All other components within normal limits  CBC - Abnormal; Notable for the following components:   Platelets 477 (*)    All other components within normal limits  I-STAT BETA HCG BLOOD, ED ([facility], [facility], [facility] ONLY)  TROPONIN I (HIGH SENSITIVITY)  TROPONIN I (HIGH SENSITIVITY)    EKG EKG Interpretation  Date/Time:  Saturday August 19 2022 22:01:38 EST Ventricular Rate:  74 PR Interval:  142 QRS Duration: 87 QT Interval:  367 QTC Calculation: 408 R Axis:   18 Text Interpretation: Sinus rhythm Baseline wander in lead(s) II V2 Normal ECG When compared with ECG of 06/19/2011, No significant change was found Confirmed by Delora Fuel [id number]) on 08/20/2022 12:33:05 AM  Radiology DG Chest 2 View  Result Date: 08/19/2022 CLINICAL DATA:  Chest pain, headache, heart palpitations and feeling clammy EXAM: CHEST - 2 VIEW COMPARISON:  06/09/2011 FINDINGS: The heart size and mediastinal contours are within normal limits. Both lungs are clear. The visualized skeletal structures are unremarkable. IMPRESSION: No active cardiopulmonary disease. Electronically Signed   By: Placido Sou M.D.   On: 08/19/2022 22:38    Procedures Procedures  Cardiac monitor shows normal sinus rhythm, per my interpretation.  Medications Ordered in ED Medications  sodium chloride 0.9 % bolus 1,000 mL (1,000 mLs Intravenous Bolus 08/20/22 0316)  prochlorperazine (COMPAZINE) injection 10 mg (10 mg Intravenous Given 08/20/22 0312)  diphenhydrAMINE (BENADRYL) injection 25 mg (25 mg Intravenous Given 08/20/22 [id number])    ED Course/ Medical Decision Making/ A&P                             Medical Decision Making Amount and/or Complexity of Data Reviewed Labs: ordered. Radiology:  ordered.  Risk Prescription drug management.   Elevated blood pressure which has resolved while in the emergency department.  Current blood pressure is actually normal.  Headache which seems most likely to be muscle contraction headache, consider migraine variant.  No red flags to suggest more serious pathology such as subarachnoid hemorrhage or meningitis.  I have reviewed and interpreted her laboratory tests and my interpretation is normal CBC except for elevated platelet level, mildly elevated random glucose level consistent with prediabetes.  I have reviewed and interpreted her electrocardiogram, my interpretation is normal ECG which is unchanged from prior.  I had a rather extensive discussion with patient about the need to monitor blood pressure at home, no need for intervention for blood pressure management today.  I have ordered a headache cocktail for her headache.  I ordered normal saline solution, prochlorperazine, diphenhydramine.  She had excellent relief of her headache with above-noted treatment.  She is discharged with instructions to check her blood pressure on a daily basis and follow-up with primary care provider.  Final Clinical Impression(s) / ED Diagnoses Final diagnoses:  Bad headache  Elevated blood pressure reading with diagnosis of hypertension    Rx / DC Orders ED Discharge Orders     None         Delora Fuel, [place] [id number] [phone]

## 2022-08-25 ENCOUNTER — Ambulatory Visit: Payer: Medicaid Other

## 2022-08-27 ENCOUNTER — Other Ambulatory Visit: Payer: Self-pay

## 2022-08-27 ENCOUNTER — Emergency Department (HOSPITAL_BASED_OUTPATIENT_CLINIC_OR_DEPARTMENT_OTHER): Payer: Medicaid Other

## 2022-08-27 ENCOUNTER — Emergency Department (HOSPITAL_BASED_OUTPATIENT_CLINIC_OR_DEPARTMENT_OTHER)
Admission: EM | Admit: 2022-08-27 | Discharge: 2022-08-27 | Disposition: A | Discharge status: Home / Self Care | Payer: Medicaid Other | Attending: Emergency Medicine | Admitting: Emergency Medicine

## 2022-08-27 ENCOUNTER — Encounter (HOSPITAL_BASED_OUTPATIENT_CLINIC_OR_DEPARTMENT_OTHER): Payer: Self-pay

## 2022-08-27 ENCOUNTER — Emergency Department (HOSPITAL_BASED_OUTPATIENT_CLINIC_OR_DEPARTMENT_OTHER): Payer: Medicaid Other | Admitting: Radiology

## 2022-08-27 DIAGNOSIS — Z79899 Other long term (current) drug therapy: Secondary | ICD-10-CM | POA: Insufficient documentation

## 2022-08-27 DIAGNOSIS — I10 Essential (primary) hypertension: Secondary | ICD-10-CM | POA: Diagnosis not present

## 2022-08-27 DIAGNOSIS — Z91040 Latex allergy status: Secondary | ICD-10-CM | POA: Diagnosis not present

## 2022-08-27 DIAGNOSIS — G43909 Migraine, unspecified, not intractable, without status migrainosus: Secondary | ICD-10-CM | POA: Insufficient documentation

## 2022-08-27 DIAGNOSIS — Z91010 Allergy to peanuts: Secondary | ICD-10-CM | POA: Insufficient documentation

## 2022-08-27 DIAGNOSIS — R519 Headache, unspecified: Secondary | ICD-10-CM

## 2022-08-27 LAB — BASIC METABOLIC PANEL
Anion gap: 13 (ref 5–15)
BUN: 13 mg/dL (ref 6–20)
CO2: 23 mmol/L (ref 22–32)
Calcium: 9.4 mg/dL (ref 8.9–10.3)
Chloride: 101 mmol/L (ref 98–111)
Creatinine, Ser: 0.81 mg/dL (ref 0.44–1.00)
GFR, Estimated: >60 mL/min (ref >60)
Glucose, Bld: 70 mg/dL (ref 70–99)
Potassium: 3.6 mmol/L (ref 3.5–5.1)
Sodium: 137 mmol/L (ref 135–145)

## 2022-08-27 LAB — CBC WITH DIFFERENTIAL/PLATELET
Abs Immature Granulocytes: 0.05 10*3/uL (ref 0.00–0.07)
Basophils Absolute: 0 10*3/uL (ref 0.0–0.1)
Basophils Relative: 0 %
Eosinophils Absolute: 0 10*3/uL (ref 0.0–0.5)
Eosinophils Relative: 0 %
HCT: 46.3 % — ABNORMAL HIGH (ref 36.0–46.0)
Hemoglobin: 15.5 g/dL — ABNORMAL HIGH (ref 12.0–15.0)
Immature Granulocytes: 0 %
Lymphocytes Relative: 33 %
Lymphs Abs: 4.3 10*3/uL — ABNORMAL HIGH (ref 0.7–4.0)
MCH: 29 pg (ref 26.0–34.0)
MCHC: 33.5 g/dL (ref 30.0–36.0)
MCV: 86.7 fL (ref 80.0–100.0)
Monocytes Absolute: 0.7 10*3/uL (ref 0.1–1.0)
Monocytes Relative: 6 %
Neutro Abs: 8.1 10*3/uL — ABNORMAL HIGH (ref 1.7–7.7)
Neutrophils Relative %: 61 %
Platelets: 448 10*3/uL — ABNORMAL HIGH (ref 150–400)
RBC: 5.34 MIL/uL — ABNORMAL HIGH (ref 3.87–5.11)
RDW: 13.1 % (ref 11.5–15.5)
WBC: 13.3 10*3/uL — ABNORMAL HIGH (ref 4.0–10.5)
nRBC: 0 % (ref 0.0–0.2)

## 2022-08-27 LAB — PREGNANCY, URINE: Preg Test, Ur: NEGATIVE

## 2022-08-27 LAB — TROPONIN I (HIGH SENSITIVITY): Troponin I (High Sensitivity): 3 ng/L (ref <18)

## 2022-08-27 MED ORDER — KETOROLAC TROMETHAMINE 15 MG/ML IJ SOLN
15.0000 mg | Freq: Once | INTRAMUSCULAR | Status: AC
  Administered 2022-08-27: 15 mg via INTRAVENOUS
  Filled 2022-08-27: qty 1

## 2022-08-27 MED ORDER — METOCLOPRAMIDE HCL 5 MG/ML IJ SOLN
10.0000 mg | Freq: Once | INTRAMUSCULAR | Status: AC
  Administered 2022-08-27: 10 mg via INTRAVENOUS
  Filled 2022-08-27: qty 2

## 2022-08-27 MED ORDER — SODIUM CHLORIDE 0.9 % IV BOLUS
1000.0000 mL | Freq: Once | INTRAVENOUS | Status: AC
  Administered 2022-08-27: 1000 mL via INTRAVENOUS

## 2022-08-27 MED ORDER — DIPHENHYDRAMINE HCL 50 MG/ML IJ SOLN
25.0000 mg | Freq: Once | INTRAMUSCULAR | Status: AC
  Administered 2022-08-27: 25 mg via INTRAVENOUS
  Filled 2022-08-27: qty 1

## 2022-08-27 NOTE — ED Provider Notes (Signed)
[facility] Provider Note   CSN: [id number] Arrival date & time: 08/27/22  1221     History  Chief Complaint  Patient presents with   Migraine    Shalayla Swartwout is a 32 y.o. female.   Migraine     Patient with medical history of hypertension presents to the emergency department due to migraine.  She has had 2 of the episodes of migraines over the last few years but not a frequent occurrence.  8 days ago she started having headache which has been constant, worse in the middle of the day.  Unable to if any alleviating factors for noise and light make it worse. never fully resolves.   Associated with nausea, vomiting and photophobia.  She has tried Compazine during previous ED visit which did not help. She has been trying Excedrin, Tylenol, Motrin with no improvement.  She does not have any fevers, lateralized weakness or numbness, loss of vision.  Not on any medications. No sudden onset thunderclap, exertional component or head trauma.   Home Medications Prior to Admission medications   Medication Sig Start Date End Date Taking? Authorizing Provider  albuterol (PROVENTIL HFA;VENTOLIN HFA) 108 (90 BASE) MCG/ACT inhaler Inhale 2 puffs into the lungs every 6 (six) hours as needed for wheezing or shortness of breath. Patient not taking: Reported on 05/23/2021    [provider]  amLODipine (NORVASC) 10 MG tablet Take 1 tablet (10 mg total) by mouth daily. 04/06/22   Gabriel Carina, CNM  cetirizine (ZYRTEC) 10 MG tablet Take 10 mg by mouth daily.    [provider]  fluticasone (FLONASE) 50 MCG/ACT nasal spray Place 1-2 sprays into both nostrils daily. 05/10/22   Gabriel Carina, CNM  gabapentin (NEURONTIN) 300 MG capsule Take by mouth. 07/25/19   [provider]  Norethindrone-Ethinyl Estradiol-Fe Biphas (LO LOESTRIN FE) 1 MG-10 MCG / 10 MCG tablet Take 1 tablet by mouth daily. 06/26/19   [provider]   QUEtiapine (SEROQUEL) 100 MG tablet Take 100 mg by mouth at bedtime. 05/11/21   [provider]      Allergies    Banana, Latex, Peanut oil, Pineapple, Tomato, and Wheat    Review of Systems   Review of Systems  Physical Exam Updated Vital Signs BP 123/72 (BP Location: Right Arm)   Pulse 99   Temp 98.3 F (36.8 C)   Resp 18   Ht 5\' 4"  (1.626 m)   Wt 95.3 kg   LMP 06/21/2022 (Approximate) Comment: pt on continuous birth control  SpO2 95%   BMI 36.05 kg/m  Physical Exam Vitals and nursing note reviewed. Exam conducted with a chaperone present.  Constitutional:      Appearance: Normal appearance.  HENT:     Head: Normocephalic and atraumatic.  Eyes:     General: No scleral icterus.       Right eye: No discharge.        Left eye: No discharge.     Extraocular Movements: Extraocular movements intact.     Pupils: Pupils are equal, round, and reactive to light.     Comments: EOMI, visual fields intact.  Limited funduscopy as pupils are not dilated but I do not appreciate papilledema  Neck:     Comments: No nuchal rigidity Cardiovascular:     Rate and Rhythm: Normal rate and regular rhythm.     Pulses: Normal pulses.     Heart sounds: Normal heart sounds.  No friction rub. No gallop.  Pulmonary:     Effort: Pulmonary effort is normal. No respiratory distress.     Breath sounds: Normal breath sounds.  Abdominal:     General: Abdomen is flat. Bowel sounds are normal. There is no distension.     Palpations: Abdomen is soft.     Tenderness: There is no abdominal tenderness.  Musculoskeletal:     Cervical back: Normal range of motion. No tenderness.  Skin:    General: Skin is warm and dry.     Coloration: Skin is not jaundiced.  Neurological:     Mental Status: She is alert. Mental status is at baseline.     Coordination: Coordination normal.     Comments: Cranial nerves II through XII gross intact, upper and lower extremity strength symmetric bilaterally      ED Results / Procedures / Treatments   Labs (all labs ordered are listed, but only abnormal results are displayed) Labs Reviewed  PREGNANCY, URINE  BASIC METABOLIC PANEL  CBC WITH DIFFERENTIAL/PLATELET  HCG, SERUM, QUALITATIVE    EKG None  Radiology No results found.  Procedures Procedures    Medications Ordered in ED Medications  sodium chloride 0.9 % bolus 1,000 mL (has no administration in time range)  metoCLOPramide (REGLAN) injection 10 mg (has no administration in time range)  diphenhydrAMINE (BENADRYL) injection 25 mg (has no administration in time range)    ED Course/ Medical Decision Making/ A&P Clinical Course as of 08/27/22 1821  Sun Aug 27, 2022  1528 Went to reevaluate the patient, states she had an episode of chest tightness earlier today she didn't mention earlier. Felt like pressure, sharp and she felt like she was going to pass out but did not lose consciousness.  Denies any shortness of breath, cough, recent travel.  She is on oral birth control but no history of PE.  Will add troponin, EKG and chest x-ray. [HS]  [id number] EKG 12-Lead Sinus rhythm, biphasic/T wave inversions in inferior leads but per chart review this is baseline and unchanged.  No reciprocal changes. [HS]  [id number] CBC with Differential(!) CBC shows signs of hemoconcentration with elevated white count, hemoglobin and platelet count.  Consistent with story of emesis and decreased oral intake. [HS]  [id number] Pregnancy, urine Not pregnant [HS]  123456 Basic metabolic panel Within normal limits, no gross electrolyte derangement or AKI [HS]  1604 DG Chest 2 View Normal cardiac silhouette, no consolidation, pneumothorax, widened mediastinum or cardiomegaly.  I agree with radiologist. [HS]  1625 I reevaluated the patient, headache has improved but not fully resolved. No recurring chest pain, sinus rhythm on cardiac monitoring per my interpretation.  [HS]    Clinical Course User Index [HS] Sherrill Raring,  PA-C                             Medical Decision Making Amount and/or Complexity of Data Reviewed Labs: ordered. Decision-making details documented in ED Course. Radiology: ordered. Decision-making details documented in ED Course. ECG/medicine tests:  Decision-making details documented in ED Course.  Risk Prescription drug management.   This is a 32 year old female presenting to the emergency department due to migraine with additional complaint of chest tightness.  Differential includes migraine, meningitis, electrolyte derangement, dehydration, AKI, SAH, IIH, CVA/TIA, medication side effect, arrhythmia, for pain/anxiety, PE, ACS.  Reviewed external medical records including neurology note, seen for shoulder injury and currently on a steroid Dosepak.  No previous neuro imaging.  Physical exam is reassuring, no SIRS criteria and patient is not septic.  There is no nuchal rigidity, no red flag symptoms to suggest bacterial meningitis.  No appreciable focal deficit on exam, regular rate and rhythm, lungs are clear.  Will start with laboratory workup, CT head given no previous imaging as well as chest x-ray, EKG and treatment with Reglan, benadryl, fluids.  Considered PE but there is no pleuritic chest pain, not tachycardic, not hypoxic. Can't PERC out given on OCPs but low risk for PE with 0 points on [place] Criteria.    EKG shows sinus rhythm, I do not think this is ACS.  Laboratory workup, chest x-ray and additional workup interpreted by myself, see ED course.  CT head is negative.    Patient feels improved, will discharge patient home with close follow-up with neurologist.           Final Clinical Impression(s) / ED Diagnoses Final diagnoses:  None    Rx / DC Orders ED Discharge Orders     None         Sherrill Raring, PA-C 08/27/22 [place], [place], DO 08/27/22 2055

## 2022-08-27 NOTE — Discharge Instructions (Signed)
You are seen today in the emergency department due to headache and chest pain.  Your workup today was reassuring, please follow-up with your primary next week for reevaluation.  Follow-up with your neurologist if the headaches persist for additional workup.  Return to the ED for new or concerning symptoms.

## 2022-08-27 NOTE — ED Triage Notes (Signed)
Patient here POV from Home.  Endorses Migraine Constant and Consistent for 8 Days. Some nausea. No Fever or Emesis. Some photosensitivity.   Inconsistent History of Migraines. Has attempted Excedrin, Ibuprofen without much relief. Seen at [facility] recently.  NAD Noted during Triage. A&Ox4. GCS 15. Ambulatory.

## 2023-02-25 ENCOUNTER — Encounter (HOSPITAL_BASED_OUTPATIENT_CLINIC_OR_DEPARTMENT_OTHER): Payer: Self-pay

## 2023-02-25 ENCOUNTER — Emergency Department (HOSPITAL_BASED_OUTPATIENT_CLINIC_OR_DEPARTMENT_OTHER)
Admission: EM | Admit: 2023-02-25 | Discharge: 2023-02-25 | Disposition: A | Discharge status: Home / Self Care | Payer: Medicaid Other

## 2023-02-25 ENCOUNTER — Other Ambulatory Visit: Payer: Self-pay

## 2023-02-25 DIAGNOSIS — Z91040 Latex allergy status: Secondary | ICD-10-CM | POA: Diagnosis not present

## 2023-02-25 DIAGNOSIS — R519 Headache, unspecified: Secondary | ICD-10-CM | POA: Diagnosis present

## 2023-02-25 DIAGNOSIS — G932 Benign intracranial hypertension: Secondary | ICD-10-CM | POA: Insufficient documentation

## 2023-02-25 DIAGNOSIS — Z91010 Allergy to peanuts: Secondary | ICD-10-CM | POA: Insufficient documentation

## 2023-02-25 DIAGNOSIS — Z79899 Other long term (current) drug therapy: Secondary | ICD-10-CM | POA: Insufficient documentation

## 2023-02-25 DIAGNOSIS — Z1152 Encounter for screening for COVID-19: Secondary | ICD-10-CM | POA: Insufficient documentation

## 2023-02-25 LAB — CBC
HCT: 39.8 % (ref 36.0–46.0)
Hemoglobin: 12.8 g/dL (ref 12.0–15.0)
MCH: 27.9 pg (ref 26.0–34.0)
MCHC: 32.2 g/dL (ref 30.0–36.0)
MCV: 86.9 fL (ref 80.0–100.0)
Platelets: 396 10*3/uL (ref 150–400)
RBC: 4.58 MIL/uL (ref 3.87–5.11)
RDW: 14.3 % (ref 11.5–15.5)
WBC: 6.4 10*3/uL (ref 4.0–10.5)
nRBC: 0 % (ref 0.0–0.2)

## 2023-02-25 LAB — RESP PANEL BY RT-PCR (RSV, FLU A&B, COVID)  RVPGX2
Influenza A by PCR: NEGATIVE
Influenza B by PCR: NEGATIVE
Resp Syncytial Virus by PCR: NEGATIVE
SARS Coronavirus 2 by RT PCR: NEGATIVE

## 2023-02-25 LAB — BASIC METABOLIC PANEL
Anion gap: 9 (ref 5–15)
BUN: 9 mg/dL (ref 6–20)
CO2: 19 mmol/L — ABNORMAL LOW (ref 22–32)
Calcium: 9 mg/dL (ref 8.9–10.3)
Chloride: 110 mmol/L (ref 98–111)
Creatinine, Ser: 0.85 mg/dL (ref 0.44–1.00)
GFR, Estimated: >60 mL/min (ref >60)
Glucose, Bld: 120 mg/dL — ABNORMAL HIGH (ref 70–99)
Potassium: 3.9 mmol/L (ref 3.5–5.1)
Sodium: 138 mmol/L (ref 135–145)

## 2023-02-25 LAB — HCG, SERUM, QUALITATIVE: Preg, Serum: NEGATIVE

## 2023-02-25 MED ORDER — METOCLOPRAMIDE HCL 5 MG/ML IJ SOLN
10.0000 mg | Freq: Once | INTRAMUSCULAR | Status: AC
  Administered 2023-02-25: 10 mg via INTRAVENOUS
  Filled 2023-02-25: qty 2

## 2023-02-25 MED ORDER — KETOROLAC TROMETHAMINE 15 MG/ML IJ SOLN
15.0000 mg | Freq: Once | INTRAMUSCULAR | Status: AC
  Administered 2023-02-25: 15 mg via INTRAVENOUS
  Filled 2023-02-25: qty 1

## 2023-02-25 MED ORDER — ACETAMINOPHEN 500 MG PO TABS
1000.0000 mg | ORAL_TABLET | Freq: Once | ORAL | Status: AC
  Administered 2023-02-25: 1000 mg via ORAL
  Filled 2023-02-25: qty 2

## 2023-02-25 MED ORDER — LACTATED RINGERS IV BOLUS
1000.0000 mL | Freq: Once | INTRAVENOUS | Status: AC
  Administered 2023-02-25: 1000 mL via INTRAVENOUS

## 2023-02-25 NOTE — Discharge Instructions (Signed)
Continue take the medications prescribed to you by your neurologist.  Return if develop fevers, chills, change in your headaches, vision changes, unilateral weakness, facial droop, seizures or you develop any new or worsening symptoms that are concerning to you.

## 2023-02-25 NOTE — ED Provider Notes (Signed)
[place] EMERGENCY DEPARTMENT AT [facility] Provider Note   CSN: [id number] Arrival date & time: 02/25/23  1225     History  Chief Complaint  Patient presents with   Migraine    Carol Rogers is a 32 y.o. female.  32 year old female presenting emergency department for headache.  She has had chronic headaches and is being worked up outpatient for acute intracranial hypertension.  Had recent MRI that was positive for intracranial hypertension, went for lumbar puncture 3 days ago with fluid taken off.  She has been taking Diamox started 2 days ago.  Reports that after the epidural she had minor headache, somewhat improved, but then worsened this morning.  Not sudden, it is worse being upright, better lying flat.  No changes in vision, unilateral weakness, facial droop, no neck stiffness or photophobia.   Migraine       Home Medications Prior to Admission medications   Medication Sig Start Date End Date Taking? Authorizing Provider  albuterol (PROVENTIL HFA;VENTOLIN HFA) 108 (90 BASE) MCG/ACT inhaler Inhale 2 puffs into the lungs every 6 (six) hours as needed for wheezing or shortness of breath. Patient not taking: Reported on 05/23/2021    [provider]  amLODipine (NORVASC) 10 MG tablet Take 1 tablet (10 mg total) by mouth daily. 04/06/22   Bernerd Limbo, CNM  cetirizine (ZYRTEC) 10 MG tablet Take 10 mg by mouth daily.    [provider]  fluticasone (FLONASE) 50 MCG/ACT nasal spray Place 1-2 sprays into both nostrils daily. 05/10/22   Bernerd Limbo, CNM  gabapentin (NEURONTIN) 300 MG capsule Take by mouth. 07/25/19   [provider]  Norethindrone-Ethinyl Estradiol-Fe Biphas (LO LOESTRIN FE) 1 MG-10 MCG / 10 MCG tablet Take 1 tablet by mouth daily. 06/26/19   [provider]  QUEtiapine (SEROQUEL) 100 MG tablet Take 100 mg by mouth at bedtime. 05/11/21   [provider]      Allergies    Banana, Compazine  [prochlorperazine], Latex, Peanut oil, Pineapple, Tomato, and Wheat    Review of Systems   Review of Systems  Physical Exam Updated Vital Signs BP 102/69 (BP Location: Right Arm)   Pulse 78   Temp 98.9 F (37.2 C) (Oral)   Resp 20   Ht 5\' 4"  (1.626 m)   Wt 113.4 kg   SpO2 98%   BMI 42.91 kg/m  Physical Exam Vitals and nursing note reviewed.  Constitutional:      General: She is not in acute distress.    Appearance: She is not toxic-appearing.  HENT:     Head: Normocephalic and atraumatic.     Mouth/Throat:     Mouth: Mucous membranes are moist.  Eyes:     Conjunctiva/sclera: Conjunctivae normal.  Cardiovascular:     Rate and Rhythm: Normal rate.  Pulmonary:     Effort: Pulmonary effort is normal.  Abdominal:     General: Abdomen is flat.  Musculoskeletal:     Cervical back: Normal range of motion and neck supple.  Skin:    General: Skin is warm.     Capillary Refill: Capillary refill takes less than 2 seconds.  Neurological:     General: No focal deficit present.     Mental Status: She is alert.     Cranial Nerves: No cranial nerve deficit.     Sensory: No sensory deficit.     Motor: No weakness.     Coordination: Coordination normal.  Psychiatric:  Mood and Affect: Mood normal.        Behavior: Behavior normal.     ED Results / Procedures / Treatments   Labs (all labs ordered are listed, but only abnormal results are displayed) Labs Reviewed  BASIC METABOLIC PANEL - Abnormal; Notable for the following components:      Result Value   CO2 19 (*)    Glucose, Bld 120 (*)    All other components within normal limits  RESP PANEL BY RT-PCR (RSV, FLU A&B, COVID)  [id number]  CBC  HCG, SERUM, QUALITATIVE    EKG None  Radiology No results found.  Procedures Procedures    Medications Ordered in ED Medications  lactated ringers bolus 1,000 mL (1,000 mLs Intravenous New Bag/Given 02/25/23 1329)  ketorolac (TORADOL) 15 MG/ML injection 15 mg (15 mg  Intravenous Given 02/25/23 1343)  acetaminophen (TYLENOL) tablet 1,000 mg (1,000 mg Oral Given 02/25/23 1346)  metoCLOPramide (REGLAN) injection 10 mg (10 mg Intravenous Given 02/25/23 1346)    ED Course/ Medical Decision Making/ A&P Clinical Course as of 02/25/23 1433  Sun Feb 25, 2023  1427 Patient reevaluated.  She is feeling improved.  Reports headache is near 50% better.  Offered further observation and possible transfer for blood patch as her headache pattern that is better lying flat and worse upright is consistent with post spinal tap headache.  Patient declined she states that she is feeling better and would like to follow-up with her neurologist.  They will call tomorrow.  Lab workup today reassuring no leukocytosis fever or tachycardia to suggest systemic infection.  No significant metabolic derangements.  She is not pregnant.  Her flu COVID test is pending.  Patient to follow-up on MyChart.  She is stable for discharge at this time. [TY]    Clinical Course User Index [TY] Coral Spikes, DO                                 Medical Decision Making 32 year old female present emergency department for headache, recent spinal tap and diagnosis of idiopathic intracranial hypertension.  She is afebrile vital signs reassuring.  Physical exam without localizing neurodeficit.  Per chart review had elevated opening pressure 26.  She is following with neurology is taking Diamox.  Considered CT head, however no localizing deficits on exam and headache consistent with prior headaches.  Will get basic labs and give migraine cocktail.  Case discussed with neurology, Dr. Selina Cooley, if no improvement after migraine cocktail would likely recommend blood patch.  See ED course for further MDM and final disposition.  Amount and/or Complexity of Data Reviewed Labs: ordered.  Risk OTC drugs. Prescription drug management.          Final Clinical Impression(s) / ED Diagnoses Final diagnoses:  None     Rx / DC Orders ED Discharge Orders     None         Coral Spikes, DO 02/25/23 1433

## 2023-02-25 NOTE — ED Triage Notes (Signed)
Patient arrives with complaints of increased headache post-spinal tap 3 days.ago. Reports she has been having a persistent Migraine since march which she had the spinal tap for. Reports headache pain 8/10.

## 2023-02-25 NOTE — ED Notes (Signed)
Dc instructions reviewed with patient. Patient voiced understanding. Dc with belongings.  °

## 2023-09-04 NOTE — Progress Notes (Unsigned)
 ANNUAL EXAM Patient name: Carol Rogers MRN [id number]  Date of birth: 18-Apr-1991 Chief Complaint:   No chief complaint on file.  History of Present Illness:   Carol Rogers is a 33 y.o. G0P0000 {race:25618} female being seen today for a routine annual exam.  Current complaints: ***   No LMP recorded. (Menstrual status: Oral contraceptives).   The pregnancy intention screening data noted above was reviewed. Potential methods of contraception were discussed. The patient elected to proceed with No data recorded.   Last pap 03/23/21. Results were: ASCUS w/ HRHPV positive: other (not 16, 18/45). H/O abnormal pap: yes Last mammogram: Never (age). Results were: N/A. Family h/o breast cancer: no Last colonoscopy: Never (age). Results were: N/A. Family h/o colorectal cancer: no     05/23/2021   11:30 AM 08/12/2019    8:34 AM  Depression screen PHQ 2/9  Decreased Interest 2 3  Down, Depressed, Hopeless 2 3  PHQ - 2 Score 4 6  Altered sleeping 0 3  Tired, decreased energy 1 3  Change in appetite 1 3  Feeling bad or failure about yourself  1 3  Trouble concentrating 2 3  Moving slowly or fidgety/restless 0 0  Suicidal thoughts 0 0  PHQ-9 Score 9 21  Difficult doing work/chores  Very difficult      05/23/2021   11:30 AM  GAD 7 : Generalized Anxiety Score  Nervous, Anxious, on Edge 2  Control/stop worrying 2  Worry too much - different things 2  Trouble relaxing 2  Restless 2  Easily annoyed or irritable 2  Afraid - awful might happen 2  Total GAD 7 Score 14     Review of Systems:   Pertinent items are noted in HPI Denies any headaches, blurred vision, fatigue, shortness of breath, chest pain, abdominal pain, abnormal vaginal discharge/itching/odor/irritation, problems with periods, bowel movements, urination, or intercourse unless otherwise stated above.  Pertinent History Reviewed:  Reviewed past medical,surgical, social and family history.  Reviewed problem list,  medications and allergies.  Physical Assessment:  There were no vitals filed for this visit. There is no height or weight on file to calculate BMI.   Physical Examination:  General appearance - well appearing, and in no distress Mental status - alert, oriented to person, place, and time Psych:  She has a normal mood and affect Skin - warm and dry, normal color, no suspicious lesions noted Chest - effort normal, no problems with respiration noted Heart - normal rate and regular rhythm Neck:  midline trachea, no thyromegaly or nodules Breasts - breasts appear normal, no suspicious masses, no skin or nipple changes or  axillary nodes Abdomen - soft, nontender, nondistended, no masses or organomegaly Pelvic - VULVA: normal appearing vulva with no masses, tenderness or lesions   VAGINA: normal appearing vagina with normal color and discharge, no lesions   CERVIX: normal appearing cervix without discharge or lesions, no CMT Thin prep pap is done with HR HPV cotesting Extremities:  No swelling or varicosities noted  Chaperone present for exam  No results found for this or any previous visit (from the past 24 hours).  Assessment & Plan:  1. Encounter for annual routine gynecological examination (Primary) - Routine preventative health maintenance measures emphasized. - Mammogram: @ 32yo, or sooner if problems - Colonoscopy: @ 33yo, or sooner if problems  2. ASCUS with positive high risk HPV cervical - Negative colpo in Dec 2022, supposed to have pap in 2023 - Will follow up results of  pap smear and manage accordingly.  3. Generalized anxiety disorder ***   No orders of the defined types were placed in this encounter.   Meds: No orders of the defined types were placed in this encounter.   Follow-up: No follow-ups on file.  Bernerd Limbo, CNM 09/04/2023 7:50 PM

## 2023-09-05 ENCOUNTER — Encounter: Payer: Self-pay | Admitting: Certified Nurse Midwife

## 2023-09-05 ENCOUNTER — Ambulatory Visit (INDEPENDENT_AMBULATORY_CARE_PROVIDER_SITE_OTHER): Payer: Self-pay | Admitting: Certified Nurse Midwife

## 2023-09-05 ENCOUNTER — Other Ambulatory Visit (HOSPITAL_COMMUNITY)
Admission: RE | Admit: 2023-09-05 | Discharge: 2023-09-05 | Disposition: A | Discharge status: Home / Self Care | Source: Ambulatory Visit | Attending: Certified Nurse Midwife | Admitting: Certified Nurse Midwife

## 2023-09-05 ENCOUNTER — Other Ambulatory Visit: Payer: Self-pay

## 2023-09-05 VITALS — BP 108/71 | HR 92 | Wt 242.8 lb

## 2023-09-05 DIAGNOSIS — R87810 Cervical high risk human papillomavirus (HPV) DNA test positive: Secondary | ICD-10-CM | POA: Insufficient documentation

## 2023-09-05 DIAGNOSIS — R87610 Atypical squamous cells of undetermined significance on cytologic smear of cervix (ASC-US): Secondary | ICD-10-CM | POA: Insufficient documentation

## 2023-09-05 DIAGNOSIS — Z01419 Encounter for gynecological examination (general) (routine) without abnormal findings: Secondary | ICD-10-CM | POA: Diagnosis not present

## 2023-09-05 DIAGNOSIS — F411 Generalized anxiety disorder: Secondary | ICD-10-CM | POA: Diagnosis not present

## 2023-09-10 LAB — CYTOLOGY - PAP
Adequacy: ABSENT
Comment: NEGATIVE
Diagnosis: UNDETERMINED — AB
High risk HPV: NEGATIVE

## 2023-09-11 ENCOUNTER — Encounter: Payer: Self-pay | Admitting: Certified Nurse Midwife

## 2024-09-17 ENCOUNTER — Ambulatory Visit: Payer: Self-pay | Admitting: Certified Nurse Midwife
